# Patient Record
Sex: Male | Born: 1965 | Race: White | Hispanic: No | Smoking: Never smoker
Health system: Southern US, Community
[De-identification: ages and names within clinical notes are randomized; demographics above are authoritative.]

## PROBLEM LIST (undated history)

## (undated) DIAGNOSIS — K5792 Diverticulitis of intestine, part unspecified, without perforation or abscess without bleeding: Secondary | ICD-10-CM

## (undated) DIAGNOSIS — H3321 Serous retinal detachment, right eye: Secondary | ICD-10-CM

## (undated) DIAGNOSIS — F329 Major depressive disorder, single episode, unspecified: Secondary | ICD-10-CM

## (undated) DIAGNOSIS — F32A Depression, unspecified: Secondary | ICD-10-CM

## (undated) DIAGNOSIS — M7070 Other bursitis of hip, unspecified hip: Secondary | ICD-10-CM

## (undated) DIAGNOSIS — F419 Anxiety disorder, unspecified: Secondary | ICD-10-CM

## (undated) HISTORY — DX: Other bursitis of hip, unspecified hip: M70.70

## (undated) HISTORY — DX: Diverticulitis of intestine, part unspecified, without perforation or abscess without bleeding: K57.92

## (undated) HISTORY — DX: Major depressive disorder, single episode, unspecified: F32.9

## (undated) HISTORY — DX: Depression, unspecified: F32.A

## (undated) HISTORY — DX: Anxiety disorder, unspecified: F41.9

## (undated) HISTORY — PX: NO PAST SURGERIES: SHX2092

## (undated) HISTORY — DX: Serous retinal detachment, right eye: H33.21

---

## 2004-10-16 ENCOUNTER — Ambulatory Visit: Payer: Self-pay | Admitting: Internal Medicine

## 2006-11-12 ENCOUNTER — Ambulatory Visit: Payer: Self-pay | Admitting: Internal Medicine

## 2006-11-13 ENCOUNTER — Ambulatory Visit: Payer: Self-pay | Admitting: Internal Medicine

## 2006-11-13 LAB — CONVERTED CEMR LAB
Albumin: 4.2 g/dL (ref 3.5–5.2)
Alkaline Phosphatase: 63 units/L (ref 39–117)
CO2: 30 meq/L (ref 19–32)
Calcium: 9.2 mg/dL (ref 8.4–10.5)
Chol/HDL Ratio, serum: 4.5
Cholesterol: 180 mg/dL (ref 0–200)
Glucose, Bld: 79 mg/dL (ref 70–99)
HDL: 39.6 mg/dL (ref >39.0)
Hemoglobin: 17.2 g/dL — ABNORMAL HIGH (ref 13.0–17.0)
LDL Cholesterol: 121 mg/dL — ABNORMAL HIGH (ref 0–99)
Platelets: 208 10*3/uL (ref 150–400)
RBC: 5.48 M/uL (ref 4.22–5.81)
TSH: 2.3 microintl units/mL (ref 0.35–5.50)
Total Protein: 6.7 g/dL (ref 6.0–8.3)
Triglyceride fasting, serum: 97 mg/dL (ref 0–149)

## 2006-12-24 ENCOUNTER — Ambulatory Visit: Payer: Self-pay | Admitting: Internal Medicine

## 2007-03-11 ENCOUNTER — Encounter: Payer: Self-pay | Admitting: Internal Medicine

## 2007-03-11 ENCOUNTER — Ambulatory Visit: Payer: Self-pay | Admitting: Internal Medicine

## 2007-03-11 DIAGNOSIS — R519 Headache, unspecified: Secondary | ICD-10-CM | POA: Insufficient documentation

## 2007-03-11 DIAGNOSIS — R51 Headache: Secondary | ICD-10-CM | POA: Insufficient documentation

## 2011-01-10 ENCOUNTER — Emergency Department (HOSPITAL_BASED_OUTPATIENT_CLINIC_OR_DEPARTMENT_OTHER)
Admission: EM | Admit: 2011-01-10 | Discharge: 2011-01-10 | Disposition: A | Discharge status: Home / Self Care | Payer: BC Managed Care – PPO | Attending: Emergency Medicine | Admitting: Emergency Medicine

## 2011-01-10 ENCOUNTER — Emergency Department (INDEPENDENT_AMBULATORY_CARE_PROVIDER_SITE_OTHER): Payer: BC Managed Care – PPO

## 2011-01-10 ENCOUNTER — Encounter (INDEPENDENT_AMBULATORY_CARE_PROVIDER_SITE_OTHER): Payer: Self-pay | Admitting: *Deleted

## 2011-01-10 DIAGNOSIS — R109 Unspecified abdominal pain: Secondary | ICD-10-CM | POA: Insufficient documentation

## 2011-01-10 DIAGNOSIS — K5732 Diverticulitis of large intestine without perforation or abscess without bleeding: Secondary | ICD-10-CM | POA: Insufficient documentation

## 2011-01-10 DIAGNOSIS — M2420 Disorder of ligament, unspecified site: Secondary | ICD-10-CM | POA: Insufficient documentation

## 2011-01-10 DIAGNOSIS — M629 Disorder of muscle, unspecified: Secondary | ICD-10-CM | POA: Insufficient documentation

## 2011-01-10 DIAGNOSIS — M7150 Other bursitis, not elsewhere classified, unspecified site: Secondary | ICD-10-CM

## 2011-01-10 LAB — URINALYSIS, ROUTINE W REFLEX MICROSCOPIC
Bilirubin Urine: NEGATIVE
Nitrite: NEGATIVE
Specific Gravity, Urine: 1.023 (ref 1.005–1.030)
pH: 6 (ref 5.0–8.0)

## 2011-01-14 ENCOUNTER — Encounter (INDEPENDENT_AMBULATORY_CARE_PROVIDER_SITE_OTHER): Payer: Self-pay | Admitting: *Deleted

## 2011-01-14 ENCOUNTER — Emergency Department (INDEPENDENT_AMBULATORY_CARE_PROVIDER_SITE_OTHER): Payer: BC Managed Care – PPO

## 2011-01-14 ENCOUNTER — Emergency Department (HOSPITAL_BASED_OUTPATIENT_CLINIC_OR_DEPARTMENT_OTHER)
Admission: EM | Admit: 2011-01-14 | Discharge: 2011-01-15 | Disposition: A | Discharge status: Other Acute Inpatient Hospital | Payer: BC Managed Care – PPO | Source: Home / Self Care | Attending: Emergency Medicine | Admitting: Emergency Medicine

## 2011-01-14 DIAGNOSIS — R109 Unspecified abdominal pain: Secondary | ICD-10-CM | POA: Insufficient documentation

## 2011-01-14 DIAGNOSIS — R197 Diarrhea, unspecified: Secondary | ICD-10-CM

## 2011-01-14 DIAGNOSIS — R110 Nausea: Secondary | ICD-10-CM | POA: Insufficient documentation

## 2011-01-14 DIAGNOSIS — K5732 Diverticulitis of large intestine without perforation or abscess without bleeding: Secondary | ICD-10-CM | POA: Insufficient documentation

## 2011-01-14 LAB — CBC
MCV: 87.2 fL (ref 78.0–100.0)
Platelets: 181 10*3/uL (ref 150–400)
RBC: 5.41 MIL/uL (ref 4.22–5.81)
RDW: 12.8 % (ref 11.5–15.5)
WBC: 8.5 10*3/uL (ref 4.0–10.5)

## 2011-01-14 LAB — BASIC METABOLIC PANEL
BUN: 17 mg/dL (ref 6–23)
Chloride: 104 mEq/L (ref 96–112)
GFR calc Af Amer: >60 mL/min (ref >60)
GFR calc non Af Amer: >60 mL/min (ref >60)
Potassium: 4.4 mEq/L (ref 3.5–5.1)
Sodium: 142 mEq/L (ref 135–145)

## 2011-01-14 LAB — URINALYSIS, ROUTINE W REFLEX MICROSCOPIC
Ketones, ur: NEGATIVE mg/dL
Nitrite: NEGATIVE
Specific Gravity, Urine: 1.011 (ref 1.005–1.030)
Urine Glucose, Fasting: NEGATIVE mg/dL
pH: 7.5 (ref 5.0–8.0)

## 2011-01-14 LAB — DIFFERENTIAL
Basophils Absolute: 0 10*3/uL (ref 0.0–0.1)
Basophils Relative: 0 % (ref 0–1)
Monocytes Relative: 6 % (ref 3–12)
Neutro Abs: 6.7 10*3/uL (ref 1.7–7.7)
Neutrophils Relative %: 79 % — ABNORMAL HIGH (ref 43–77)

## 2011-01-14 MED ORDER — IOHEXOL 300 MG/ML  SOLN
100.0000 mL | Freq: Once | INTRAMUSCULAR | Status: AC | PRN
  Administered 2011-01-14: 100 mL via INTRAVENOUS

## 2011-01-15 ENCOUNTER — Encounter (INDEPENDENT_AMBULATORY_CARE_PROVIDER_SITE_OTHER): Payer: Self-pay | Admitting: *Deleted

## 2011-01-15 ENCOUNTER — Inpatient Hospital Stay (HOSPITAL_COMMUNITY): Payer: BC Managed Care – PPO

## 2011-01-15 ENCOUNTER — Observation Stay (HOSPITAL_COMMUNITY)
Admission: AD | Admit: 2011-01-15 | Discharge: 2011-01-16 | DRG: 183 | Disposition: A | Discharge status: Home / Self Care | Payer: BC Managed Care – PPO | Source: Other Acute Inpatient Hospital | Attending: Family Medicine | Admitting: Family Medicine

## 2011-01-15 DIAGNOSIS — K5732 Diverticulitis of large intestine without perforation or abscess without bleeding: Secondary | ICD-10-CM

## 2011-01-15 DIAGNOSIS — R933 Abnormal findings on diagnostic imaging of other parts of digestive tract: Secondary | ICD-10-CM

## 2011-01-15 DIAGNOSIS — R7309 Other abnormal glucose: Secondary | ICD-10-CM | POA: Diagnosis present

## 2011-01-15 DIAGNOSIS — M6289 Other specified disorders of muscle: Secondary | ICD-10-CM | POA: Diagnosis present

## 2011-01-15 DIAGNOSIS — R1032 Left lower quadrant pain: Secondary | ICD-10-CM

## 2011-01-15 LAB — COMPREHENSIVE METABOLIC PANEL
ALT: 22 U/L (ref 0–53)
AST: 16 U/L (ref 0–37)
Albumin: 3.2 g/dL — ABNORMAL LOW (ref 3.5–5.2)
CO2: 27 mEq/L (ref 19–32)
Calcium: 8.7 mg/dL (ref 8.4–10.5)
Chloride: 109 mEq/L (ref 96–112)
Creatinine, Ser: 1.08 mg/dL (ref 0.4–1.5)
GFR calc Af Amer: >60 mL/min (ref >60)
Sodium: 144 mEq/L (ref 135–145)
Total Bilirubin: 1 mg/dL (ref 0.3–1.2)

## 2011-01-15 LAB — CLOSTRIDIUM DIFFICILE BY PCR: Toxigenic C. Difficile by PCR: NEGATIVE

## 2011-01-15 LAB — GLUCOSE, CAPILLARY
Glucose-Capillary: 96 mg/dL (ref 70–99)
Glucose-Capillary: 129 mg/dL — ABNORMAL HIGH (ref 70–99)

## 2011-01-15 LAB — CBC
Hemoglobin: 14.4 g/dL (ref 13.0–17.0)
MCH: 31.9 pg (ref 26.0–34.0)
Platelets: 148 10*3/uL — ABNORMAL LOW (ref 150–400)
RBC: 4.52 MIL/uL (ref 4.22–5.81)

## 2011-01-16 LAB — BASIC METABOLIC PANEL
CO2: 27 mEq/L (ref 19–32)
Chloride: 109 mEq/L (ref 96–112)
GFR calc Af Amer: >60 mL/min (ref >60)
Potassium: 3.8 mEq/L (ref 3.5–5.1)
Sodium: 139 mEq/L (ref 135–145)

## 2011-01-16 LAB — CBC
Hemoglobin: 13.7 g/dL (ref 13.0–17.0)
Platelets: 151 10*3/uL (ref 150–400)
RBC: 4.45 MIL/uL (ref 4.22–5.81)
WBC: 5.5 10*3/uL (ref 4.0–10.5)

## 2011-01-16 LAB — GLUCOSE, CAPILLARY: Glucose-Capillary: 115 mg/dL — ABNORMAL HIGH (ref 70–99)

## 2011-01-30 NOTE — Discharge Summary (Signed)
NAMEBERTRAM, Ian Vasquez NO.:  [id number]  MEDICAL RECORD NO.:  [id number]           PATIENT TYPE:  I  LOCATION:  5504                         FACILITY:  [facility]  PHYSICIAN:  Brendia Sacks, MD    DATE OF BIRTH:  1965/12/21  DATE OF ADMISSION:  01/15/2011 DATE OF DISCHARGE:  01/16/2011                              DISCHARGE SUMMARY   PRIMARY CARE PHYSICIAN:  Willow Ora, MD  PRIMARY GASTROENTEROLOGIST:  Hedwig Morton. Juanda Chance, MD  CONDITION ON DISCHARGE:  Improved.  DISCHARGE DIAGNOSES: 1. Acute sigmoid diverticulitis and improving distal descending colon     diverticulitis. 2. Mild fasting hyperglycemia.  HISTORY OF PRESENT ILLNESS:  This is a 45 year old male who presented to the emergency room with increasing abdominal pain and diarrhea.  He had recently been diagnosed by CT with diverticulitis and discharged from the emergency room on Cipro and Flagyl.  He was compliant with these medications, however, his condition worsened.  HOSPITAL COURSE: 1. Acute sigmoid diverticulitis.  The patient was admitted to the     Medical Floor, seen in consultation with Gastroenterology.  Given     that he had not responded to Cipro and Flagyl, he was changed to     Zosyn.  He has had marked improvement in his symptoms.  He has had     no pain.  He has not required any pain medications in the last 24     hours and he has tolerated a solid diet.  He has been ambulating in     the hallways without difficulty.  He has been seen by     Gastroenterology today and cleared for discharge.  It has been     recommended he complete a 14-day course of Augmentin.  He has also     been instructed on low-residue diet and diverticular diet.  I have     discussed the case at this point with him and his wife and they are     agreeable with discharge. 2. Mild fasting hyperglycemia of unclear clinical significance given     his acute illness.  Consider testing in the outpatient setting to  exclude diabetes as well as impaired fasting glucose.  CONSULTATIONS:  Gastroenterology and their recommendations as above.  PROCEDURES:  None.  IMAGING: 1. CT of the abdomen and pelvis on January 14, 2011:  Mild acute     sigmoid colon diverticulitis.  Extensive diverticular disease of     the colon.  Persistent but decreasing acute diverticulitis changes     of the distal ascending colon.  Negative for abscess or     perforation.  Stable 9-mm low-density area in the left iliopsoas     muscle which may reflect bursitis or possibly a ganglion. 2. Abdominal film on January 15, 2011:  No acute findings.  MICROBIOLOGY:  None.  PERTINENT LABORATORY STUDIES: 1. Clostridium difficile by PCR negative. 2. CBC unremarkable. 3. Basic metabolic panel unremarkable with exception of mild elevation     of glucose of 102 to 111 in a fasting state. 4. Urinalysis negative.  PHYSICAL EXAMINATION ON DISCHARGE:  GENERAL:  The patient is feeling well.  He is ambulating in the hallways.  He has no abdominal pain.  He tolerated diet. VITAL SIGNS:  Temperature is 97.9, pulse 73, respirations 20, blood pressure 130/81, O2 sat 98% on room air. CARDIOVASCULAR:  Regular rate and rhythm.  No murmur, rub, or gallop. RESPIRATORY:  Clear to auscultation bilaterally.  No wheezes, rales, or rhonchi.  Normal respiratory effort. EXTREMITIES:  No lower extremity edema. ABDOMEN:  Soft, nontender, nondistended.  DISCHARGE INSTRUCTIONS:  The patient will be discharged home today. Diet is a low-residue diet.  If he has abdominal pain, he should switch to liquids.  He may increase activity slowly.  He may return to work on January 20, 2011.  Follow up with Dr. Lina Sar on February 26, 2011, at 9 a.m. and with Dr. Drue Novel in 2-4 weeks.  DISCHARGE MEDICATIONS: 1. Augmentin 875 mg p.o. b.i.d. to complete a 14-day course. 2. Claritin 10 mg p.o. daily as needed for allergies. 3. Multivitamin daily.  Discontinue the  following medications: 1. Ciprofloxacin. 2. Flagyl. 3. Suggest discontinuing his [employer] supplement and protein supplement for     this point in time.  THINGS TO FOLLOW UP IN THE OUTPATIENT SETTING: 1. Resolution of diverticulitis.  Consider colonoscopy when resolved. 2. Fasting hyperglycemia.  See discussion above.  TIME COORDINATING DISCHARGE:  35 minutes.     Brendia Sacks, MD     DG/MEDQ  D:  01/16/2011  T:  01/17/2011  Job:  [id number]  cc:   Willow Ora, MD Hedwig Morton. Juanda Chance, MD  Electronically Signed by Brendia Sacks  on 01/30/2011 08:06:33 AM

## 2011-02-03 NOTE — H&P (Signed)
Ian Vasquez, FELDNER               ACCOUNT NO.:  [id number]  MEDICAL RECORD NO.:  [id number]           PATIENT TYPE:  I  LOCATION:  5504                         FACILITY:  [facility]  PHYSICIAN:  Eduard Clos, MDDATE OF BIRTH:  22-Mar-1966  DATE OF ADMISSION:  01/15/2011 DATE OF DISCHARGE:                             HISTORY & PHYSICAL   PRIMARY CARE PHYSICIAN:  At [facility], the patient does not recall the name at this time.  CHIEF COMPLAINT:  Abdominal pain and diarrhea.  HISTORY OF PRESENT ILLNESS:  A 45 year old male with no significant past medical history who has been experiencing abdominal pain over the last 1 week.  He had gone initially to the ER where he had a CT abdomen and pelvis on January 10, 2011, which showed acute diverticulitis of the distal descending colon.  The patient was given Cipro and Flagyl and sent home after which the patient felt better, but again the pain started coming last 24 hours with worsening diarrhea, at least 10 episodes.  Denies any blood in the stools.  The patient comes back and in the ER, the patient had repeat CT abdomen and pelvis the last evening, which showed mild acute sigmoid colon diverticulitis and also persistent but decreasing acute diverticulitis of the distal descending colon.  The ER physician had called on-call gastroenterologist of [place] who advised admission.  At this time, the patient is admitted for further observation for his diverticulitis.  The patient denies any nausea or vomiting.  Denies any chest pain. Denies shortness of breath, fever, chills, palpitations, dizziness, loss of consciousness, or any focal deficit.  PAST MEDICAL HISTORY:  Nothing significant.  PAST SURGICAL HISTORY:  None.  MEDICATIONS ON ADMISSION:  Just recently on Cipro and Flagyl, otherwise does not take a regular medication.  ALLERGIES:  No known drug allergies.  FAMILY HISTORY:  Significant for coronary artery disease  in grandparents.  SOCIAL HISTORY:  The patient denies smoking cigarettes, drinking alcohol, or using illegal drugs.  Married.  REVIEW OF SYSTEMS:  As per the history of present illness, nothing else significant.  PHYSICAL EXAMINATION:  GENERAL:  The patient was examined at bedside, not in acute distress. VITAL SIGNS:  Blood pressure 130/76, pulse 70 per minute, temperature 97.8, respirations 18, O2 sat 96% on room air. HEENT:  Anicteric.  No pallor.  No discharge from ears, eyes, nose, or mouth. NECK:  No neck rigidity. CHEST:  Bilateral air entry present.  No rhonchi.  No crepitation. HEART:  S1 and S2 heard. ABDOMEN:  Soft.  There is tenderness in the suprapubic area.  The patient stated that earlier on the pain used to be on the left upper quadrant.  No guarding or rigidity.  Bowel sounds are present. CNS:  Alert, awake, and oriented to time, place, and person.  Moves upper and lower extremities 5/5. EXTREMITIES:  Peripheral pulses felt.  No edema.  LABORATORY DATA:  CT of abdomen and pelvis done today shows mild acute sigmoid colon diverticulitis, which is new compared to the CT of January 10, 2011.  The patient has extensive diverticular disease of the sigmoid colon, although the  patient's acute illness resolved.  Followup colonoscopy should be considered given the associated colonic wall thickening, persistent but decreasing acute diverticulitis changes of the distal descending colon compared to January 12, 2011.  Negative for abscess or perforation.  Stable 9-mm low density in the left iliopsoas muscle.  This could reflect changes of bursitis or possibly a ganglion. CBC:  WBC is 8.5, hemoglobin is 17.3, hematocrit 47.2, platelets 181. Basic metabolic panel of sodium 142, potassium 4.4, chloride 104, carbon dioxide 28, glucose 101, BUN 17, creatinine 1, calcium 9.1.  UA is negative.  Fecal occult blood is negative.  ASSESSMENT: 1. Acute sigmoid diverticulitis with recent  descending colon     diverticulitis. 2. Stable 9-mm low density area in the left iliopsoas muscle, which     could be bursitis or ganglion.  PLAN: 1. Admit the patient to medical floor. 2. For his diverticulitis at this time, the patient will be kept     n.p.o., pain relief medication, IV hydration.  Repeat labs in a.m.     and if the patient's pain is improving, start clear liquid diet and     also get a KUB. 3. We will get stool for C. diff, PCR. 4. Once the patient's acute episode is over, the patient will need     colonoscopy as outpatient.     Eduard Clos, MD     ANK/MEDQ  D:  01/15/2011  T:  01/15/2011  Job:  [id number]  Electronically Signed by Midge Minium MD on 02/03/2011 04:45:29 PM

## 2011-02-26 ENCOUNTER — Encounter: Payer: Self-pay | Admitting: Internal Medicine

## 2011-02-26 ENCOUNTER — Ambulatory Visit (INDEPENDENT_AMBULATORY_CARE_PROVIDER_SITE_OTHER): Payer: BC Managed Care – PPO | Admitting: Internal Medicine

## 2011-02-26 VITALS — Ht 73.0 in | Wt 209.0 lb

## 2011-02-26 DIAGNOSIS — K5730 Diverticulosis of large intestine without perforation or abscess without bleeding: Secondary | ICD-10-CM

## 2011-02-26 MED ORDER — PEG-KCL-NACL-NASULF-NA ASC-C 100 G PO SOLR: 1.0000 | Freq: Once | ORAL | Status: AC

## 2011-02-26 NOTE — Progress Notes (Signed)
History of Present Illness:  This is a  45 year old white male status post hospitalization for diverticulitis from 2/8 to 01/16/2011 . A CT scan showed inflammatory changes in the sigmoid colon.  He responded to intravenous Zosyn and was discharged on Augmentin which he completed several weeks ago. He has normal bowel habits, no rectal bleeding and no abdominal pain. He is here to discuss having a screening colonoscopy.        Review of Systems:    t ROS is negative.   Physical Exam: General appearance alert oriented x 3. In no distress. Eyes- non icteric Mouth no lesions Neck supple without adenopathy, thyroid not enlarged, no carotid bruits Lungs Clear to auscultation Cor normal S1 normal S2 , no murmur,  quiet precordium Abdomen, soft scaphoid abdomen with normal active bowel sounds. No point tenderness. Liver edge at costal margin. No CVA tenderness Rectal no perianal disease, normal rectal sphincter tone, hemoccult negative stool Extremities no pedal edema Skin no lesions Neurological no asterixis Psychological normal affect  Assessment and Plan:   45 year old white male status post a first episode of diverticulitis documented on CT scan. He is currently asymptomatic. We will schedule a screening colonoscopy to assess the severity of his diverticulosis and to rule out other causes for his  abdominal pain. He is to continue on his fiber supplements and high fiber diet.

## 2011-02-26 NOTE — Patient Instructions (Signed)
You have been scheduled for a colonoscopy on 03/13/11. Please follow separate instruction sheet.

## 2011-02-27 ENCOUNTER — Encounter: Payer: Self-pay | Admitting: Internal Medicine

## 2011-03-12 ENCOUNTER — Encounter: Payer: Self-pay | Admitting: Internal Medicine

## 2011-03-13 ENCOUNTER — Encounter: Payer: Self-pay | Admitting: Internal Medicine

## 2011-03-13 ENCOUNTER — Ambulatory Visit (AMBULATORY_SURGERY_CENTER): Payer: BC Managed Care – PPO | Admitting: Internal Medicine

## 2011-03-13 VITALS — BP 143/71 | HR 71 | Temp 97.8°F | Resp 16

## 2011-03-13 DIAGNOSIS — K5732 Diverticulitis of large intestine without perforation or abscess without bleeding: Secondary | ICD-10-CM

## 2011-03-13 DIAGNOSIS — R933 Abnormal findings on diagnostic imaging of other parts of digestive tract: Secondary | ICD-10-CM

## 2011-03-13 DIAGNOSIS — R109 Unspecified abdominal pain: Secondary | ICD-10-CM

## 2011-03-13 DIAGNOSIS — K5730 Diverticulosis of large intestine without perforation or abscess without bleeding: Secondary | ICD-10-CM

## 2011-03-13 DIAGNOSIS — Z1211 Encounter for screening for malignant neoplasm of colon: Secondary | ICD-10-CM

## 2011-03-13 MED ORDER — SODIUM CHLORIDE 0.9 % IV SOLN: 500.0000 mL | INTRAVENOUS | Status: DC

## 2011-03-13 NOTE — Patient Instructions (Signed)
Severe diverticulosis otherwise normal exam High Fiber diet. Recall colonoscopy in 10 yrs. See blue and green sheets for additional d/c instructions

## 2011-03-17 ENCOUNTER — Telehealth: Payer: Self-pay

## 2011-03-17 NOTE — Telephone Encounter (Signed)
No answer. Unable to leave message.

## 2012-04-05 IMAGING — CT CT ABD-PELV W/ CM
2 of 5 series · 15 of 46 positions shown, 17 images · IV contrast (agent unspecified)
Comparison: CT abdomen pelvis 01/10/2011, performed without
contrast.

CLINICAL DATA: Abdominal pain and diarrhea.

CT ABDOMEN AND PELVIS WITH CONTRAST
TECHNIQUE: Multidetector CT imaging of the abdomen and pelvis was
performed following the standard protocol during bolus
administration of intravenous contrast.
Contrast: 100 ml

[Series 2: abd/pelvis 5.0 b31f · axial · 0.73mm/px · z∈[-491,-51]mm · 12 of 100 slices shown, 14 images]
[im 6/100  soft-tissue]
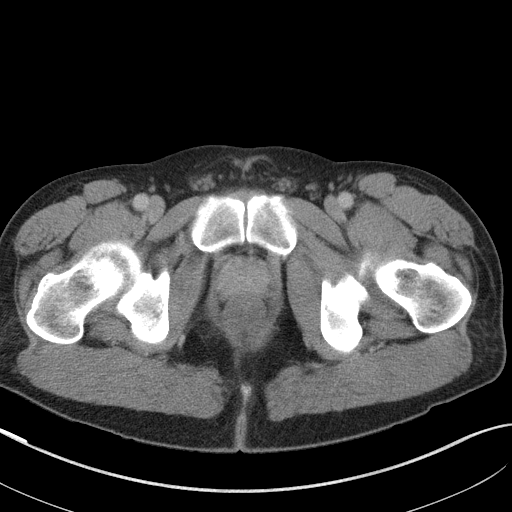
[im 6/100  bone]
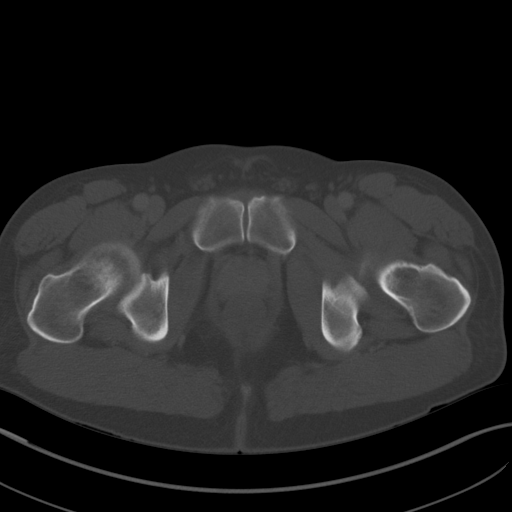
[im 16/100  soft-tissue]
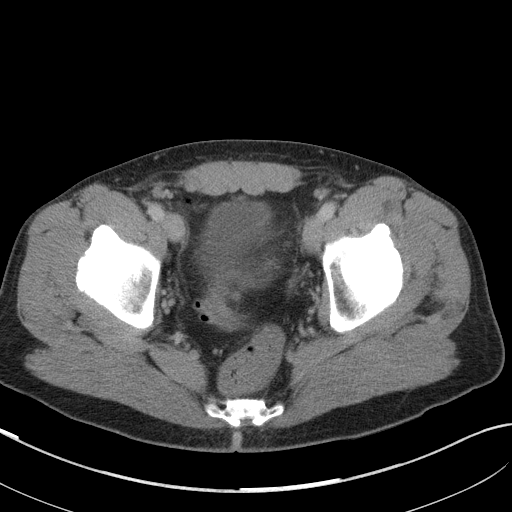
[im 21/100  soft-tissue]
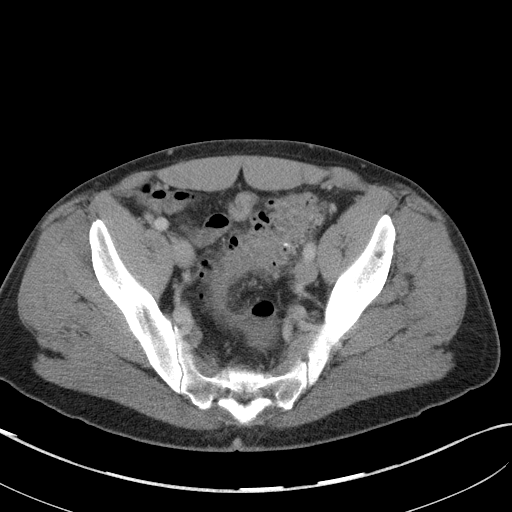
[im 32/100  soft-tissue]
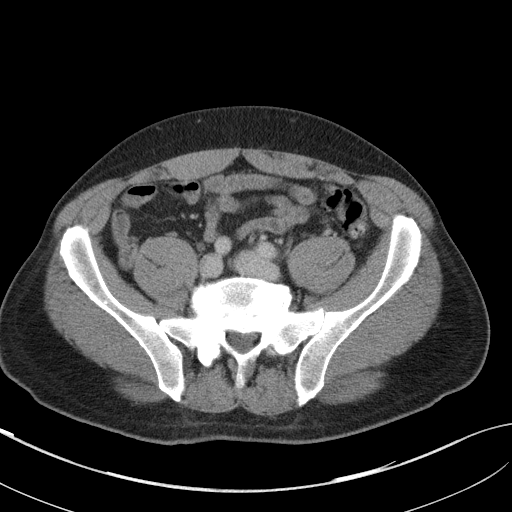
[im 37/100  soft-tissue]
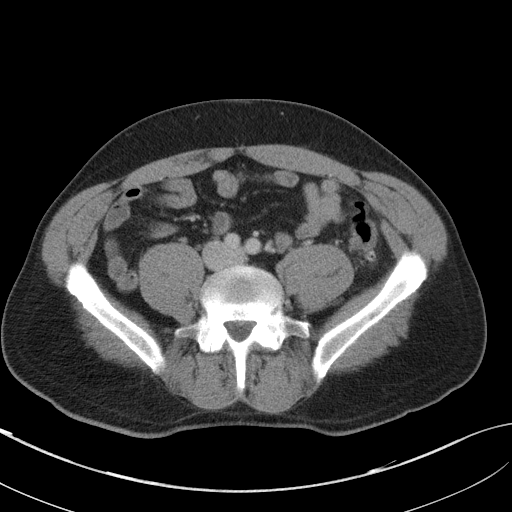
[im 47/100  soft-tissue]
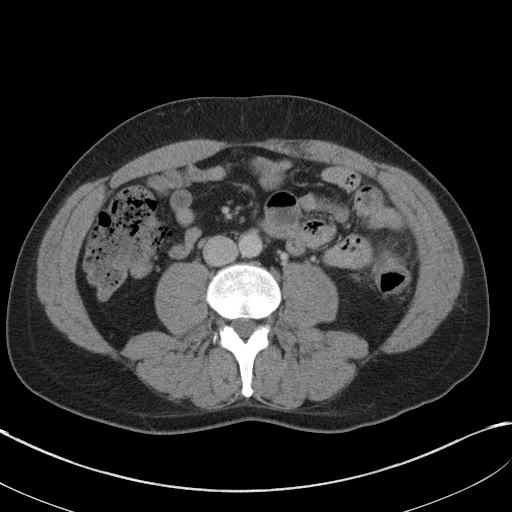
[im 53/100  soft-tissue]
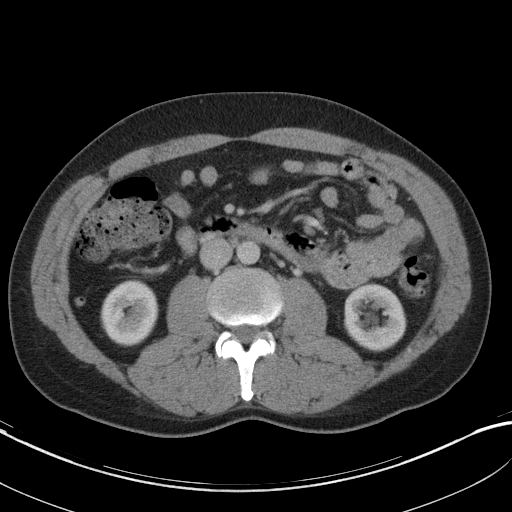
[im 63/100  soft-tissue]
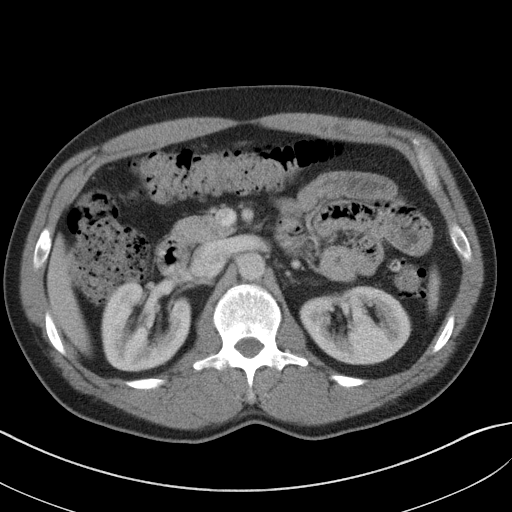
[im 68/100  soft-tissue]
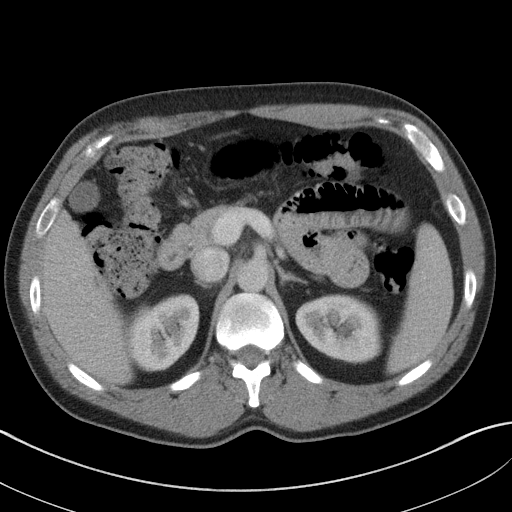
[im 68/100  bone]
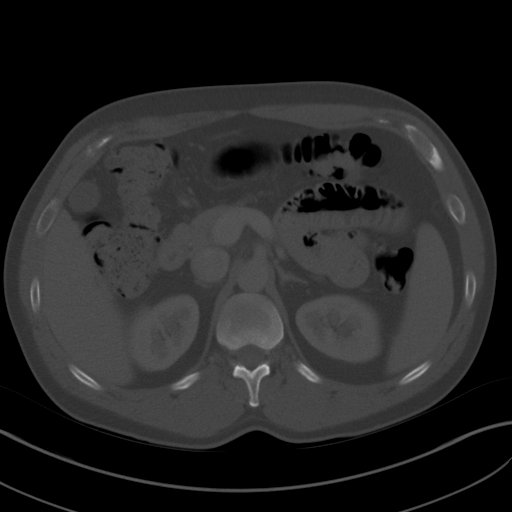
[im 79/100  soft-tissue]
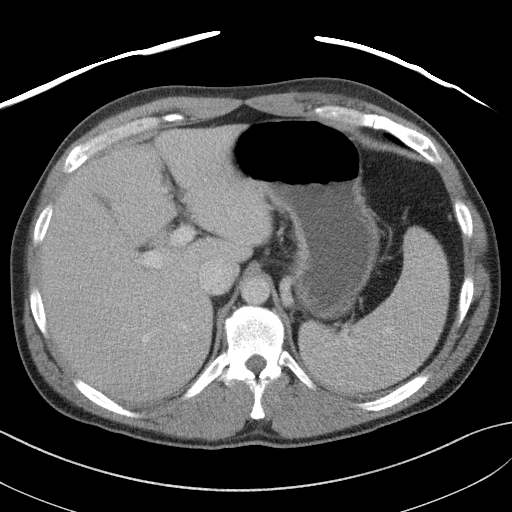
[im 84/100  soft-tissue]
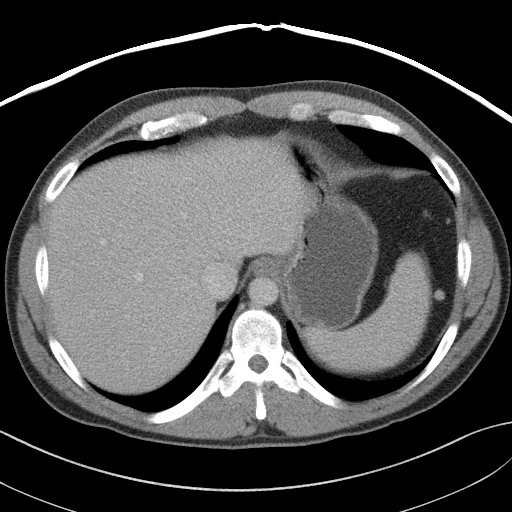
[im 94/100  soft-tissue]
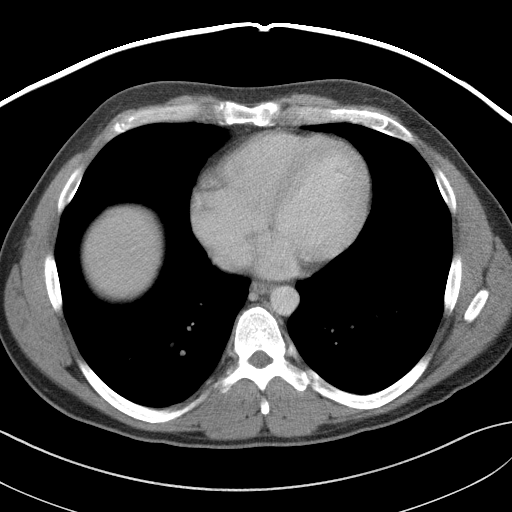

[Series 5: abd/pelvis 3.0 coronal · coronal · 0.72mm/px · 3 of 87 slices shown]
[im 29/87  soft-tissue]
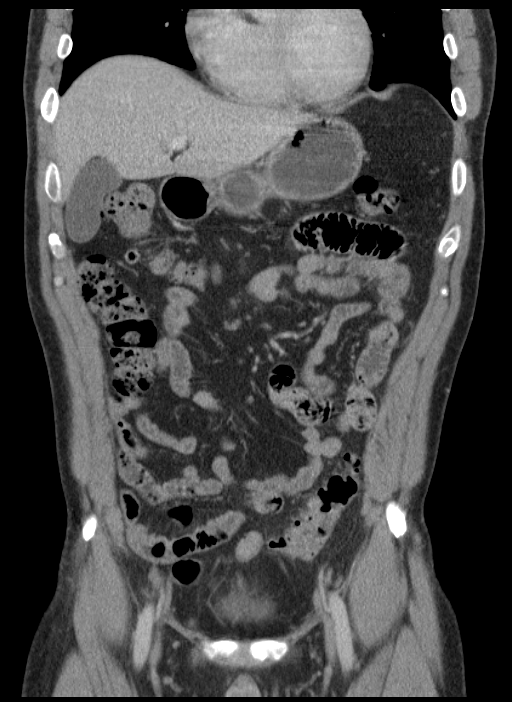
[im 39/87  soft-tissue]
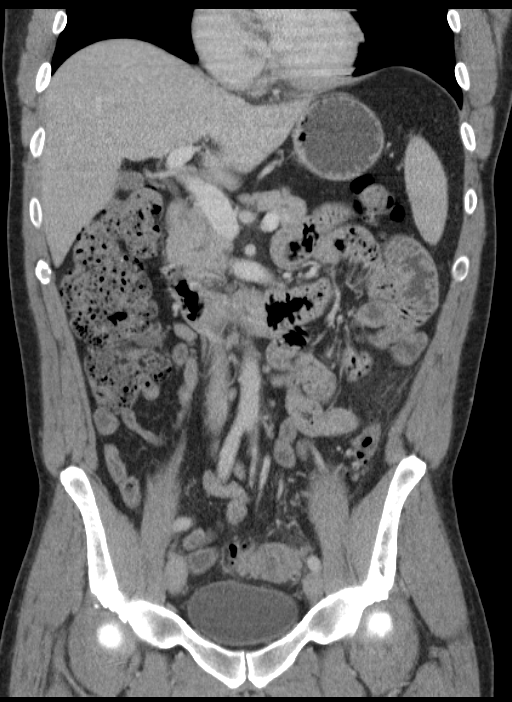
[im 48/87  soft-tissue]
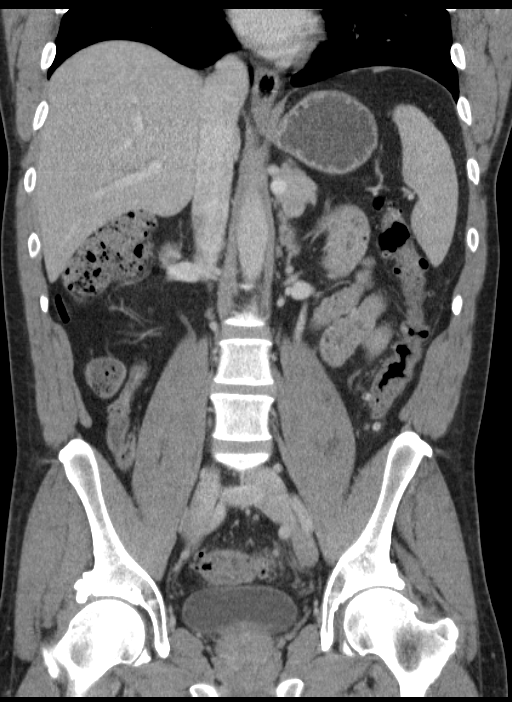

[15 of 46 positions shown; findings below may reference images not displayed]

FINDINGS: The lung bases are clear and heart size is normal.

Previously identified acute diverticulitis changes associated with
the distal descending colon persist but are less prominent compared
to the study of 01/10/2011.  Specifically, the mesenteric stranding
has decreased, but has not resolved.  There is no extraluminal gas
or evidence of abscess.

There is extensive diverticular disease involving the sigmoid
colon, with innumerable diverticula and evidence of colonic wall
thickening.  There is haziness in the mesenteries surrounding the
proximal sigmoid colon in the midline pelvis.  This haziness is new
compared to prior examination and suggests mild acute
diverticulitis involving the sigmoid colon.  No extraluminal gas,
abscess, or ascites.

There is no lymphadenopathy.

The liver, gallbladder, spleen, pancreas, adrenal glands, and
kidneys show no acute or suspicious findings.  Small bilateral
parapelvic renal cysts are noted.  On delayed images there is
symmetric excretion of contrast in both kidneys.  The ureters are
normal in caliber.  The abdominal aorta is normal in caliber.
There is no lymphadenopathy or free intraperitoneal air.  The
appendix is normal.  The urinary bladder, prostate gland, seminal
vesicles are unremarkable.  Inguinal regions appear normal.

9 mm low density area in the left iliopsoas muscle is unchanged
compared to prior study. Degenerative disc disease noted at L5-S1.
There are degenerative changes of the hips bilaterally, right
greater than left.
IMPRESSION: 1.  Mild acute sigmoid colon diverticulitis.  This is new compared
to the CT of 01/10/2011.  The patient has extensive diverticular
disease of the sigmoid colon.  After the patient's acute illness
resolves, follow-up colonoscopy should be considered, given the
associated colonic wall thickening.

2.  Persistent but decreasing acute diverticulitis changes of the
distal descending colon compared to 01/10/2011.
3.  Negative for abscess or perforation.
4.  Stable 9 mm low density area in the left iliopsoas muscle.
This could reflect changes of bursitis, or possibly a ganglion.

## 2013-08-16 ENCOUNTER — Ambulatory Visit (INDEPENDENT_AMBULATORY_CARE_PROVIDER_SITE_OTHER): Payer: BC Managed Care – PPO | Admitting: Internal Medicine

## 2013-08-16 ENCOUNTER — Encounter: Payer: Self-pay | Admitting: Internal Medicine

## 2013-08-16 VITALS — BP 147/84 | HR 73 | Temp 97.9°F | Ht 73.0 in | Wt 213.8 lb

## 2013-08-16 DIAGNOSIS — Z0000 Encounter for general adult medical examination without abnormal findings: Secondary | ICD-10-CM

## 2013-08-16 DIAGNOSIS — Z23 Encounter for immunization: Secondary | ICD-10-CM

## 2013-08-16 LAB — CBC WITH DIFFERENTIAL/PLATELET
Eosinophils Relative: 3.1 % (ref 0.0–5.0)
HCT: 46.7 % (ref 39.0–52.0)
Hemoglobin: 16.1 g/dL (ref 13.0–17.0)
Lymphs Abs: 2.3 10*3/uL (ref 0.7–4.0)
Monocytes Relative: 6.8 % (ref 3.0–12.0)
Neutro Abs: 2.6 10*3/uL (ref 1.4–7.7)
RBC: 5.07 Mil/uL (ref 4.22–5.81)
WBC: 5.4 10*3/uL (ref 4.5–10.5)

## 2013-08-16 LAB — COMPREHENSIVE METABOLIC PANEL
ALT: 19 U/L (ref 0–53)
CO2: 29 mEq/L (ref 19–32)
Calcium: 8.8 mg/dL (ref 8.4–10.5)
Chloride: 108 mEq/L (ref 96–112)
Creatinine, Ser: 1 mg/dL (ref 0.4–1.5)
GFR: 83.84 mL/min (ref >60.00)
Sodium: 141 mEq/L (ref 135–145)
Total Protein: 6.4 g/dL (ref 6.0–8.3)

## 2013-08-16 LAB — TSH: TSH: 2.34 u[IU]/mL (ref 0.35–5.50)

## 2013-08-16 NOTE — Progress Notes (Signed)
  Subjective:    Patient ID: Ian Vasquez, male    DOB: 1966-01-29, 47 y.o.   MRN: [id number]  HPI New pt, CPX  Past Medical History  Diagnosis Date  . Diverticulitis   . Diverticulosis   . Iliopsoas bursitis    Past Surgical History  Procedure Laterality Date  . Neck dissection     History   Social History  . Marital Status: Married    Spouse Name: N/A    Number of Children: 2  . Years of Education: N/A   Occupational History  . Employed Engineer, production   Social History Main Topics  . Smoking status: Never Smoker   . Smokeless tobacco: Never Used  . Alcohol Use: Yes     Comment: one a week   . Drug Use: No  . Sexual Activity: Not on file   Other Topics Concern  . Not on file   Social History Narrative  . No narrative on file   Family History  Problem Relation Age of Onset  . Colon cancer Neg Hx   . Prostate cancer Neg Hx   . CAD Other     GM dx in her 55s  . Diabetes Neg Hx   . Hypertension Father      Review of Systems Diet--regular healthy Exercise--very active, does the elliptical frequently. Occasional pain in the thoracic paraspinal muscles when he does the elliptical, no radiation, symptoms are not consistent. Denies chest pain, shortness of breath. No nausea, vomiting, diarrhea or blood in the stools. No dysuria, gross hematuria, difficulty urinating. No anxiety depression.     Objective:   Physical Exam General -- alert, well-developed, NAD.  Neck --no thyromegaly , normal carotid pulse  Lungs -- normal respiratory effort, no intercostal retractions, no accessory muscle use, and normal breath sounds.  Heart-- normal rate, regular rhythm, no murmur.  Abdomen-- Not distended, good bowel sounds,soft, non-tender. No rebound or rigidity. Tspine: no TTP Extremities-- no pretibial edema bilaterally  Neurologic-- alert & oriented X3. Speech, gait normal. Strength normal in all extremities.  Psych-- Cognition and judgment appear  intact. Alert and cooperative with normal attention span and concentration. not anxious appearing and not depressed appearing.     Assessment & Plan:

## 2013-08-16 NOTE — Patient Instructions (Signed)
Get your blood work before you leave  Next visit in  1 year for a physical exam Please make an appointment before you leave the office today (or call few weeks in advance)

## 2013-08-16 NOTE — Assessment & Plan Note (Addendum)
Last Tdap~ 10 years, one provided today cscope 2012-- tics, diverticulitis Labs EKG-- iRBBB, no old EKGs, no sx  BP 147/84 Diet-exercise : doing well RTC 1 year Tspine pain--stretching, motrin, call if sx increase

## 2013-08-18 ENCOUNTER — Telehealth: Payer: Self-pay | Admitting: *Deleted

## 2013-08-18 ENCOUNTER — Encounter: Payer: Self-pay | Admitting: *Deleted

## 2013-08-18 NOTE — Telephone Encounter (Signed)
Message copied by Eustace Quail on Thu Aug 18, 2013  4:26 PM ------      Message from: Ian Vasquez      Created: Thu Aug 18, 2013  4:18 PM       Send a letter      Marcial Pacas,      Your liver, kidney, potassium, blood count and thyroid tests are normal.      Your cholesterol is very good at 146.      These are good results! ------

## 2013-08-18 NOTE — Telephone Encounter (Signed)
Letter sent. DJR  

## 2014-08-23 ENCOUNTER — Encounter: Payer: Self-pay | Admitting: Internal Medicine

## 2014-08-23 ENCOUNTER — Ambulatory Visit (INDEPENDENT_AMBULATORY_CARE_PROVIDER_SITE_OTHER): Payer: BC Managed Care – PPO | Admitting: Internal Medicine

## 2014-08-23 VITALS — BP 130/68 | HR 63 | Temp 98.7°F | Ht 73.0 in | Wt 220.0 lb

## 2014-08-23 DIAGNOSIS — Z0000 Encounter for general adult medical examination without abnormal findings: Secondary | ICD-10-CM

## 2014-08-23 DIAGNOSIS — M25511 Pain in right shoulder: Secondary | ICD-10-CM

## 2014-08-23 DIAGNOSIS — M25519 Pain in unspecified shoulder: Secondary | ICD-10-CM | POA: Insufficient documentation

## 2014-08-23 NOTE — Progress Notes (Signed)
Pre visit review using our clinic review tool, if applicable. No additional management support is needed unless otherwise documented below in the visit note. 

## 2014-08-23 NOTE — Patient Instructions (Signed)
Get your blood work before you leave   Motrin 200 mg 2 tablets every 6 hours as needed for pain. Always take it with food because may cause gastritis and ulcers. If you notice nausea, stomach pain, change in the color of stools --->  Stop the medicine and let us know   Please come back to the office in 1 year for a physical exam. Come back fasting

## 2014-08-23 NOTE — Assessment & Plan Note (Signed)
2 months history of pain in the right trapezoid area,  Trapezoid a sprain? Other considerations are a radiculopathy or intrinsic shoulder problem. Plan: Motrin Ortho referral

## 2014-08-23 NOTE — Assessment & Plan Note (Signed)
Tdap 2014 Flu shot declined cscope 2012-- tics, diverticulitis Labs  BP today ok, check at the dentist x2/ year and is normal Diet-exercise : counseled  RTC 1 year

## 2014-08-23 NOTE — Progress Notes (Signed)
   Subjective:    Patient ID: PAGE LANCON, male    DOB: 08/08/1966, 48 y.o.   MRN: [id number]  DOS:  08/23/2014 Type of visit - description : CPX Interval history: In general doing well except for the last 2 months when gradually developed pain at the right trapezoid area. Has not taken any medication for the pain, denies neck pain per se, no upper extremity numbness, no actual injury.    ROS No  CP, SOB. No palpitations Denies  nausea, vomiting diarrhea, blood in the stools (-) cough, sputum production (-) wheezing, chest congestion No dysuria, gross hematuria, difficulty urinating  No anxiety, depression  Past Medical History  Diagnosis Date  . Diverticulitis   . Diverticulosis   . Iliopsoas bursitis     Past Surgical History  Procedure Laterality Date  . Neck dissection      History   Social History  . Marital Status: Married    Spouse Name: N/A    Number of Children: 2  . Years of Education: N/A   Occupational History  . Employed Sports coach   Social History Main Topics  . Smoking status: Never Smoker   . Smokeless tobacco: Never Used  . Alcohol Use: Yes     Comment: one a week   . Drug Use: No  . Sexual Activity: Not on file   Other Topics Concern  . Not on file   Social History Narrative   Lives w/ wife        Medication List       This list is accurate as of: 08/23/14  6:14 PM.  Always use your most recent med list.               BIOTIN PO  Take 2 tablets by mouth daily.     FIBER FORMULA Caps  Two by mouth once daily     MULTIVITAMIN PO  One tablet by mouth once daily           Objective:   Physical Exam  Musculoskeletal:       Arms:  BP 130/68  Pulse 63  Temp(Src) 98.7 F (37.1 C) (Oral)  Ht 6\' 1"  (1.854 m)  Wt 220 lb (99.791 kg)  BMI 29.03 kg/m2  SpO2 99% General -- alert, well-developed, NAD.  Neck --no thyromegaly , normal carotid pulse . No TTP, Range of motion is normal although it triggers mild  pain on the right trapezoid area. HEENT-- Not pale.  Lungs -- normal respiratory effort, no intercostal retractions, no accessory muscle use, and normal breath sounds.  Heart-- normal rate, regular rhythm, no murmur.  Abdomen-- Not distended, good bowel sounds,soft, non-tender.  Extremities-- no pretibial edema bilaterally  Left shoulder normal Right shoulder with normal range of motion and minimal pain. No deformities. Not tender at the a.c. joint   Neurologic--  alert & oriented X3. Speech normal, gait appropriate for age, strength symmetric and appropriate for age.  DTRs symmetric.  Psych-- Cognition and judgment appear intact. Cooperative with normal attention span and concentration. No anxious or depressed appearing.        Assessment & Plan:

## 2014-08-24 LAB — BASIC METABOLIC PANEL
BUN: 18 mg/dL (ref 6–23)
CHLORIDE: 104 meq/L (ref 96–112)
CO2: 26 meq/L (ref 19–32)
Calcium: 8.8 mg/dL (ref 8.4–10.5)
Creatinine, Ser: 0.9 mg/dL (ref 0.4–1.5)
GFR: 91.83 mL/min (ref >60.00)
Glucose, Bld: 75 mg/dL (ref 70–99)
POTASSIUM: 3.5 meq/L (ref 3.5–5.1)
Sodium: 137 mEq/L (ref 135–145)

## 2014-08-24 LAB — LIPID PANEL
CHOL/HDL RATIO: 4
CHOLESTEROL: 144 mg/dL (ref 0–200)
HDL: 37.9 mg/dL — AB (ref >39.00)
LDL Cholesterol: 87 mg/dL (ref 0–99)
NonHDL: 106.1
TRIGLYCERIDES: 97 mg/dL (ref 0.0–149.0)
VLDL: 19.4 mg/dL (ref 0.0–40.0)

## 2014-11-22 ENCOUNTER — Encounter (HOSPITAL_BASED_OUTPATIENT_CLINIC_OR_DEPARTMENT_OTHER): Payer: Self-pay

## 2014-11-22 ENCOUNTER — Emergency Department (HOSPITAL_BASED_OUTPATIENT_CLINIC_OR_DEPARTMENT_OTHER): Payer: BC Managed Care – PPO

## 2014-11-22 ENCOUNTER — Emergency Department (HOSPITAL_BASED_OUTPATIENT_CLINIC_OR_DEPARTMENT_OTHER)
Admission: EM | Admit: 2014-11-22 | Discharge: 2014-11-22 | Disposition: A | Discharge status: Home / Self Care | Payer: BC Managed Care – PPO | Attending: Emergency Medicine | Admitting: Emergency Medicine

## 2014-11-22 DIAGNOSIS — R070 Pain in throat: Secondary | ICD-10-CM | POA: Diagnosis present

## 2014-11-22 DIAGNOSIS — R0789 Other chest pain: Secondary | ICD-10-CM | POA: Diagnosis not present

## 2014-11-22 DIAGNOSIS — R0602 Shortness of breath: Secondary | ICD-10-CM | POA: Insufficient documentation

## 2014-11-22 DIAGNOSIS — Z8739 Personal history of other diseases of the musculoskeletal system and connective tissue: Secondary | ICD-10-CM | POA: Diagnosis not present

## 2014-11-22 DIAGNOSIS — J385 Laryngeal spasm: Secondary | ICD-10-CM | POA: Diagnosis not present

## 2014-11-22 DIAGNOSIS — Z79899 Other long term (current) drug therapy: Secondary | ICD-10-CM | POA: Insufficient documentation

## 2014-11-22 DIAGNOSIS — R0989 Other specified symptoms and signs involving the circulatory and respiratory systems: Secondary | ICD-10-CM | POA: Diagnosis not present

## 2014-11-22 DIAGNOSIS — Z8719 Personal history of other diseases of the digestive system: Secondary | ICD-10-CM | POA: Insufficient documentation

## 2014-11-22 LAB — BASIC METABOLIC PANEL
Anion gap: 10 (ref 5–15)
BUN: 14 mg/dL (ref 6–23)
CALCIUM: 9 mg/dL (ref 8.4–10.5)
CO2: 27 mEq/L (ref 19–32)
CREATININE: 0.9 mg/dL (ref 0.50–1.35)
Chloride: 106 mEq/L (ref 96–112)
GFR calc Af Amer: >90 mL/min (ref >90)
GLUCOSE: 89 mg/dL (ref 70–99)
Potassium: 3.9 mEq/L (ref 3.7–5.3)
Sodium: 143 mEq/L (ref 137–147)

## 2014-11-22 LAB — CBC WITH DIFFERENTIAL/PLATELET
BASOS ABS: 0 10*3/uL (ref 0.0–0.1)
BASOS PCT: 0 % (ref 0–1)
Band Neutrophils: 0 % (ref 0–10)
Blasts: 0 %
EOS ABS: 0.2 10*3/uL (ref 0.0–0.7)
Eosinophils Relative: 3 % (ref 0–5)
HCT: 47.6 % (ref 39.0–52.0)
Hemoglobin: 17.1 g/dL — ABNORMAL HIGH (ref 13.0–17.0)
Lymphocytes Relative: 43 % (ref 12–46)
Lymphs Abs: 2.2 10*3/uL (ref 0.7–4.0)
MCH: 32.1 pg (ref 26.0–34.0)
MCHC: 35.9 g/dL (ref 30.0–36.0)
MCV: 89.5 fL (ref 78.0–100.0)
MONO ABS: 0.4 10*3/uL (ref 0.1–1.0)
MONOS PCT: 8 % (ref 3–12)
MYELOCYTES: 0 %
Metamyelocytes Relative: 0 %
NEUTROS ABS: 2.2 10*3/uL (ref 1.7–7.7)
NRBC: 0 /100{WBCs}
Neutrophils Relative %: 46 % (ref 43–77)
PLATELETS: 140 10*3/uL — AB (ref 150–400)
Promyelocytes Absolute: 0 %
RBC: 5.32 MIL/uL (ref 4.22–5.81)
RDW: 12.9 % (ref 11.5–15.5)
WBC: 5 10*3/uL (ref 4.0–10.5)

## 2014-11-22 LAB — TROPONIN I
Troponin I: <0.3 ng/mL (ref <0.30)
Troponin I: <0.3 ng/mL (ref <0.30)

## 2014-11-22 MED ORDER — OXYCODONE-ACETAMINOPHEN 5-325 MG PO TABS
1.0000 | ORAL_TABLET | Freq: Once | ORAL | Status: DC
  Filled 2014-11-22: qty 1

## 2014-11-22 MED ORDER — FAMOTIDINE 20 MG PO TABS: 20.0000 mg | ORAL_TABLET | Freq: Two times a day (BID) | ORAL | Status: DC

## 2014-11-22 MED ORDER — ASPIRIN 81 MG PO CHEW
324.0000 mg | CHEWABLE_TABLET | Freq: Once | ORAL | Status: AC
  Administered 2014-11-22: 324 mg via ORAL
  Filled 2014-11-22: qty 4

## 2014-11-22 MED ORDER — PANTOPRAZOLE SODIUM 20 MG PO TBEC: 20.0000 mg | DELAYED_RELEASE_TABLET | Freq: Every day | ORAL | Status: DC

## 2014-11-22 MED ORDER — GI COCKTAIL ~~LOC~~
30.0000 mL | Freq: Once | ORAL | Status: AC
  Administered 2014-11-22: 30 mL via ORAL
  Filled 2014-11-22: qty 30

## 2014-11-22 MED ORDER — PREDNISONE 50 MG PO TABS: 50.0000 mg | ORAL_TABLET | Freq: Every day | ORAL | Status: DC

## 2014-11-22 MED ORDER — PREDNISONE 50 MG PO TABS: 60.0000 mg | ORAL_TABLET | Freq: Once | ORAL | Status: DC

## 2014-11-22 MED ORDER — PREDNISONE 50 MG PO TABS
60.0000 mg | ORAL_TABLET | Freq: Once | ORAL | Status: AC
  Administered 2014-11-22: 60 mg via ORAL
  Filled 2014-11-22 (×2): qty 1

## 2014-11-22 NOTE — Discharge Instructions (Signed)
Your chest xray and lab work did not show any major findings to explain your symptoms. It is possible that your symptoms are caused by inflammation, infection, or spasms in your larynx. Take protonix and pepcid as prescribed for possible reflux. Prednisone for inflammation. If symptoms continue, follow up with ENT as referred. Return if worsening symptoms, unable to breathe, any new concerning symptom.   Laryngitis At the top of your windpipe is your voice box. It is the source of your voice. Inside your voice box are 2 bands of muscles called vocal cords. When you breathe, your vocal cords are relaxed and open so that air can get into the lungs. When you decide to say something, these cords come together and vibrate. The sound from these vibrations goes into your throat and comes out through your mouth as sound. Laryngitis is an inflammation of the vocal cords that causes hoarseness, cough, loss of voice, sore throat, and dry throat. Laryngitis can be temporary (acute) or long-term (chronic). Most cases of acute laryngitis improve with time.Chronic laryngitis lasts for more than 3 weeks. CAUSES Laryngitis can often be related to excessive smoking, talking, or yelling, as well as inhalation of toxic fumes and allergies. Acute laryngitis is usually caused by a viral infection, vocal strain, measles or mumps, or bacterial infections. Chronic laryngitis is usually caused by vocal cord strain, vocal cord injury, postnasal drip, growths on the vocal cords, or acid reflux. SYMPTOMS   Cough.  Sore throat.  Dry throat. RISK FACTORS  Respiratory infections.  Exposure to irritating substances, such as cigarette smoke, excessive amounts of alcohol, stomach acids, and workplace chemicals.  Voice trauma, such as vocal cord injury from shouting or speaking too loud. DIAGNOSIS  Your cargiver will perform a physical exam. During the physical exam, your caregiver will examine your throat. The most common sign  of laryngitis is hoarseness. Laryngoscopy may be necessary to confirm the diagnosis of this condition. This procedure allows your caregiver to look into the larynx. HOME CARE INSTRUCTIONS  Drink enough fluids to keep your urine clear or pale yellow.  Rest until you no longer have symptoms or as directed by your caregiver.  Breathe in moist air.  Take all medicine as directed by your caregiver.  Do not smoke.  Talk as little as possible (this includes whispering).  Write on paper instead of talking until your voice is back to normal.  Follow up with your caregiver if your condition has not improved after 10 days. SEEK MEDICAL CARE IF:   You have trouble breathing.  You cough up blood.  You have persistent fever.  You have increasing pain.  You have difficulty swallowing. MAKE SURE YOU:  Understand these instructions.  Will watch your condition.  Will get help right away if you are not doing well or get worse. Document Released: 11/24/2005 Document Revised: 02/16/2012 Document Reviewed: 01/30/2011 [facility] Patient Information 2015 [place], [place]. This information is not intended to replace advice given to you by your health care provider. Make sure you discuss any questions you have with your health care provider.

## 2014-11-22 NOTE — ED Notes (Signed)
Pt sts some chest discomfort this morning with tightness in the chest. NAD at this time

## 2014-11-22 NOTE — ED Notes (Addendum)
Pt states he woke this am feeling like something was stuck in his throat-"discomfort" worse when lies down and describes as pressure-felt SOB-NAD at this time-pt also states he did have chest "tightness" earlier

## 2014-11-22 NOTE — ED Notes (Signed)
Pt sts "I'm ready to go" upon this RN entering room. Pt made aware that we were waiting on the results of lab test before discharge

## 2014-11-22 NOTE — ED Provider Notes (Signed)
CSN: [id number]     Arrival date & time 11/22/14  1145 History   First MD Initiated Contact with Patient 11/22/14 1156     Chief Complaint  Patient presents with  . Throat pain      (Consider location/radiation/quality/duration/timing/severity/associated sxs/prior Treatment) HPI Ian Vasquez is a 48 y.o. male with no major medical problems, presents to emergency department complaining of feeling like something is stuck in his throat and chest tightness. Patient states symptoms began when he woke up this morning around 10 AM. States at first he just noticed discomfort in his throat, states had a feeling like "something was lodged in there." States he last ate around 3 AM, ate peanuts, and had no difficulty eating. He states he also started feeling some tightness in the upper chest. States had some associated shortness of breath. Symptoms are worse when he is laying down flat. Improve when he sitting up or walking. No exertional worsening in his symptoms. States he did try to eat some bread and drink some fluids and had no trouble swallowing. He denies any sore throat. Denies any fever or chills. No nasal congestion. No abdominal pain. No nausea, vomiting. Denies history of the same.  Past Medical History  Diagnosis Date  . Diverticulitis   . Diverticulosis   . Iliopsoas bursitis    History reviewed. No pertinent past surgical history. Family History  Problem Relation Age of Onset  . Colon cancer Neg Hx   . Prostate cancer Neg Hx   . CAD Other     GM dx in her 59s  . Diabetes Neg Hx   . Hypertension Father    History  Substance Use Topics  . Smoking status: Never Smoker   . Smokeless tobacco: Never Used  . Alcohol Use: Yes     Comment: one a week     Review of Systems  Constitutional: Negative for fever and chills.  HENT: Positive for trouble swallowing. Negative for sore throat.   Respiratory: Positive for chest tightness and shortness of breath. Negative for cough.    Cardiovascular: Negative for chest pain, palpitations and leg swelling.  Gastrointestinal: Negative for nausea, vomiting, abdominal pain, diarrhea and abdominal distention.  Musculoskeletal: Negative for myalgias, arthralgias, neck pain and neck stiffness.  Skin: Negative for rash.  Allergic/Immunologic: Negative for immunocompromised state.  Neurological: Negative for dizziness, weakness, light-headedness, numbness and headaches.  All other systems reviewed and are negative.     Allergies  Review of patient's allergies indicates no known allergies.  Home Medications   Prior to Admission medications   Medication Sig Start Date End Date Taking? Authorizing Provider  BIOTIN PO Take 2 tablets by mouth daily.    Historical Provider, MD  FIBER FORMULA CAPS Two by mouth once daily     Historical Provider, MD  Multiple Vitamin (MULTIVITAMIN PO) One tablet by mouth once daily     Historical Provider, MD   BP 152/98 mmHg  Pulse 72  Temp(Src) 97.6 F (36.4 C) (Oral)  Resp 16  Ht 6' (1.829 m)  Wt 210 lb (95.255 kg)  BMI 28.47 kg/m2  SpO2 100% Physical Exam  Constitutional: He is oriented to person, place, and time. He appears well-developed and well-nourished. No distress.  HENT:  Head: Normocephalic and atraumatic.  Mouth/Throat: Oropharynx is clear and moist. No oropharyngeal exudate.  Oropharynx normal  Eyes: Conjunctivae are normal.  Neck: Neck supple.  Cardiovascular: Normal rate, regular rhythm and normal heart sounds.   Pulmonary/Chest: Effort normal. No  respiratory distress. He has no wheezes. He has no rales. He exhibits no tenderness.  Abdominal: Soft. Bowel sounds are normal. He exhibits no distension. There is no tenderness. There is no rebound.  Musculoskeletal: He exhibits no edema.  Neurological: He is alert and oriented to person, place, and time.  Skin: Skin is warm and dry.  Nursing note and vitals reviewed.   ED Course  Procedures (including critical care  time) Labs Review Labs Reviewed  CBC WITH DIFFERENTIAL - Abnormal; Notable for the following:    Hemoglobin 17.1 (*)    Platelets 140 (*)    All other components within normal limits  TROPONIN I  BASIC METABOLIC PANEL  TROPONIN I    Imaging Review Dg Chest 2 View  11/22/2014   CLINICAL DATA:  Sensation of something stuck in throat and upper chest, beginning this morning.  EXAM: CHEST  2 VIEW  COMPARISON:  None.  FINDINGS: The heart size and mediastinal contours are within normal limits. Both lungs are clear. The visualized skeletal structures are unremarkable. No radiopaque foreign body.  IMPRESSION: No active cardiopulmonary disease.   Electronically Signed   By: Rolm Baptise M.D.   On: 11/22/2014 12:51     EKG Interpretation None      MDM   Final diagnoses:  Chest tightness  Foreign body sensation in throat  Laryngeal spasm   Patient with sensation of foreign body in his esophagus, however is able to swallow, eat, drink with no difficulties. He also reports some chest tightness and some shortness of breath only with lying down flat. Will get x-ray and labs. Patient has had a family history of cardiac disease, no other risk factors.  Patient monitored for close to 3 hours. He continues to have the symptoms. His symptoms did not worsen. He denies any current shortness of breath. He denies any pain, he continues to feel "tightness in his throat/esophagus." He is able to eat and drink without difficulties. Discussed with Dr. Colin Rhein, who had seen patient as well. Would question whether this could be laryngospasm is, versus laryngeal inflammation from acid reflux or a viral infection. At this time patient is afebrile. He is nontoxic appearing. No respiratory distress. Vital signs are all normal. Will refer to ENT. Instructed to return to the closest emergency department if feeling worse. PT voiced understanding  Filed Vitals:   11/22/14 1151 11/22/14 1209 11/22/14 1418 11/22/14 1450   BP: 152/98 134/93 130/88 133/80  Pulse: 72 76 71 72  Temp: 97.6 F (36.4 C)   98.4 F (36.9 C)  TempSrc: Oral   Oral  Resp: 16 22 18 18   Height: 6' (1.829 m)     Weight: 210 lb (95.255 kg)     SpO2: 100% 100% 99% 100%     Renold Genta, PA-C 11/22/14 2245  Debby Freiberg, MD 11/27/14 [phone]

## 2016-02-15 ENCOUNTER — Telehealth: Payer: Self-pay

## 2016-02-15 NOTE — Telephone Encounter (Signed)
Called patient. Unable to leave message for call back due to patiients voicemail not being set up.

## 2016-02-18 ENCOUNTER — Ambulatory Visit (INDEPENDENT_AMBULATORY_CARE_PROVIDER_SITE_OTHER): Payer: 59 | Admitting: Internal Medicine

## 2016-02-18 ENCOUNTER — Encounter: Payer: Self-pay | Admitting: Internal Medicine

## 2016-02-18 VITALS — BP 116/62 | HR 64 | Temp 97.9°F | Ht 73.25 in | Wt 223.0 lb

## 2016-02-18 DIAGNOSIS — Z125 Encounter for screening for malignant neoplasm of prostate: Secondary | ICD-10-CM

## 2016-02-18 DIAGNOSIS — Z114 Encounter for screening for human immunodeficiency virus [HIV]: Secondary | ICD-10-CM | POA: Diagnosis not present

## 2016-02-18 DIAGNOSIS — Z0000 Encounter for general adult medical examination without abnormal findings: Secondary | ICD-10-CM

## 2016-02-18 DIAGNOSIS — Z09 Encounter for follow-up examination after completed treatment for conditions other than malignant neoplasm: Secondary | ICD-10-CM

## 2016-02-18 DIAGNOSIS — D225 Melanocytic nevi of trunk: Secondary | ICD-10-CM | POA: Diagnosis not present

## 2016-02-18 LAB — COMPLETE METABOLIC PANEL WITH GFR
ALBUMIN: 3.9 g/dL (ref 3.6–5.1)
ALT: 17 U/L (ref 9–46)
AST: 11 U/L (ref 10–35)
Alkaline Phosphatase: 66 U/L (ref 40–115)
BILIRUBIN TOTAL: 0.7 mg/dL (ref 0.2–1.2)
BUN: 13 mg/dL (ref 7–25)
CALCIUM: 8.5 mg/dL — AB (ref 8.6–10.3)
CO2: 29 mmol/L (ref 20–31)
Chloride: 105 mmol/L (ref 98–110)
Creat: 0.89 mg/dL (ref 0.70–1.33)
GFR, Est African American: >89 mL/min (ref >=60)
GLUCOSE: 91 mg/dL (ref 65–99)
Potassium: 4.2 mmol/L (ref 3.5–5.3)
SODIUM: 140 mmol/L (ref 135–146)
TOTAL PROTEIN: 5.9 g/dL — AB (ref 6.1–8.1)

## 2016-02-18 NOTE — Assessment & Plan Note (Addendum)
Tdap 2014   cscope 2012-- tics, diverticulitis, no FH Prostate ca screening: DRE (-), check a PSA  Labs   Diet-exercise : counseled  RTC 1 year

## 2016-02-18 NOTE — Progress Notes (Signed)
Subjective:    Patient ID: Ian Vasquez, male    DOB: [birth year], 50 y.o.   MRN: [id number]  DOS:  02/18/2016 Type of visit - description : CPX Interval history:no major concerns    Review of Systems Constitutional: No fever. No chills. No unexplained wt changes. No unusual sweats  HEENT: No dental problems, no ear discharge, no facial swelling, no voice changes. No eye discharge, no eye  redness , no  intolerance to light   Respiratory: No wheezing , no  difficulty breathing. No cough , no mucus production  Cardiovascular: No CP, no leg swelling , no  Palpitations  GI: no nausea, no vomiting, no diarrhea  In the last couple of years had 2 episodes of lower abdominal pain, he feels related to self resolved diverticulitis. No blood in the stools. No dysphagia, no odynophagia    Endocrine: No polyphagia, no polyuria , no polydipsia  GU: No dysuria, gross hematuria, difficulty urinating. No urinary urgency, no frequency.  Musculoskeletal: No joint swellings or unusual aches or pains  Skin: No change in the color of the skin, palor , no  Rash  Allergic, immunologic: No environmental allergies , no  food allergies  Neurological: No dizziness no  syncope. No headaches. No diplopia, no slurred, no slurred speech, no motor deficits, no facial  Numbness  Hematological: No enlarged lymph nodes, no easy bruising , no unusual bleedings  Psychiatry: No suicidal ideas, no hallucinations, no beavior problems, no confusion.  No unusual/severe anxiety, no depression .  Past Medical History  Diagnosis Date  . Diverticulitis   . Diverticulosis   . Iliopsoas bursitis     History reviewed. No pertinent past surgical history.  Social History   Social History  . Marital Status: Married    Spouse Name: N/A  . Number of Children: 2  . Years of Education: N/A   Occupational History  . 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 Smoking status: Never Smoker    . Smokeless tobacco: Never Used  . Alcohol Use: Yes     Comment: one a week   . Drug Use: No  . Sexual Activity: Not on file   Other Topics Concern  . Not on file   Social History Narrative   Lives w/ wife   Twins, 24 y/o     Family History  Problem Relation Age of Onset  . Colon cancer Neg Hx   . Prostate cancer Neg Hx   . CAD Other     GM dx in her 32s  . Diabetes Neg Hx   . Hypertension Father       Medication List       This list is accurate as of: 02/18/16 11:59 PM.  Always use your most recent med list.               MULTIVITAMIN PO  One tablet by mouth once daily           Objective:   Physical Exam  Skin:      BP 116/62 mmHg  Pulse 64  Temp(Src) 97.9 F (36.6 C) (Oral)  Ht 6' 1.25" (1.861 m)  Wt 223 lb (101.152 kg)  BMI 29.21 kg/m2  SpO2 96%  . General:   Well developed, well nourished . NAD.  Neck: No  thyromegaly , normal carotid pulse HEENT:  Normocephalic . Face symmetric, atraumatic Lungs:  CTA B Normal respiratory effort, no intercostal retractions, no  accessory muscle use. Heart: RRR,  no murmur.  No pretibial edema bilaterally  Abdomen:  Not distended, soft, non-tender. No rebound or rigidity.   Rectal:  External abnormalities: none. Normal sphincter tone. No rectal masses or tenderness.  No stools Prostate: Prostate gland firm and smooth, no enlargement, nodularity, tenderness, mass, asymmetry or induration.  Skin: Exposed areas without rash. Not pale. Not jaundice Neurologic:  alert & oriented X3.  Speech normal, gait appropriate for age and unassisted Strength symmetric and appropriate for age.  Psych: Cognition and judgment appear intact.  Cooperative with normal attention span and concentration.  Behavior appropriate. No anxious or depressed appearing.[id number]    Assessment & Plan:   Assessment H/o Diverticulitis , admitted x 1  PLAN: H/o diverticulitis, asymptomatic but has rarely pain, observ Moles : has a  dark nevi @ back, refer to derm RTC 1 year

## 2016-02-18 NOTE — Progress Notes (Signed)
Pre visit review using our clinic review tool, if applicable. No additional management support is needed unless otherwise documented below in the visit note. 

## 2016-02-18 NOTE — Patient Instructions (Signed)
GO TO THE LAB : Get the blood work     GO TO THE FRONT DESK Schedule your next appointment for a physical exam When? 1 year  Fasting?  Yes

## 2016-02-19 ENCOUNTER — Encounter: Payer: Self-pay | Admitting: Internal Medicine

## 2016-02-19 DIAGNOSIS — Z09 Encounter for follow-up examination after completed treatment for conditions other than malignant neoplasm: Secondary | ICD-10-CM | POA: Insufficient documentation

## 2016-02-19 LAB — CBC WITH DIFFERENTIAL/PLATELET
BASOS PCT: 0.3 % (ref 0.0–3.0)
Basophils Absolute: 0 10*3/uL (ref 0.0–0.1)
EOS ABS: 0.1 10*3/uL (ref 0.0–0.7)
Eosinophils Relative: 1.6 % (ref 0.0–5.0)
HCT: 45.5 % (ref 39.0–52.0)
Hemoglobin: 15.8 g/dL (ref 13.0–17.0)
Lymphocytes Relative: 20.5 % (ref 12.0–46.0)
Lymphs Abs: 1.5 10*3/uL (ref 0.7–4.0)
MCHC: 34.6 g/dL (ref 30.0–36.0)
MCV: 91.5 fl (ref 78.0–100.0)
Monocytes Absolute: 0.4 10*3/uL (ref 0.1–1.0)
Monocytes Relative: 4.9 % (ref 3.0–12.0)
Neutro Abs: 5.5 10*3/uL (ref 1.4–7.7)
Neutrophils Relative %: 72.7 % (ref 43.0–77.0)
Platelets: 167 10*3/uL (ref 150.0–400.0)
RBC: 4.97 Mil/uL (ref 4.22–5.81)
RDW: 13.1 % (ref 11.5–15.5)
WBC: 7.5 10*3/uL (ref 4.0–10.5)

## 2016-02-19 LAB — LIPID PANEL
Cholesterol: 143 mg/dL (ref 0–200)
HDL: 41.5 mg/dL (ref >39.00)
LDL CALC: 74 mg/dL (ref 0–99)
NonHDL: 101.34
Total CHOL/HDL Ratio: 3
Triglycerides: 136 mg/dL (ref 0.0–149.0)
VLDL: 27.2 mg/dL (ref 0.0–40.0)

## 2016-02-19 LAB — HIV ANTIBODY (ROUTINE TESTING W REFLEX): HIV: NONREACTIVE

## 2016-02-19 LAB — PSA: PSA: 0.49 ng/mL (ref 0.10–4.00)

## 2016-02-19 LAB — TSH: TSH: 1.14 u[IU]/mL (ref 0.35–4.50)

## 2016-02-19 NOTE — Assessment & Plan Note (Signed)
H/o diverticulitis, asymptomatic but has rarely pain, observ Moles : has a dark nevi @ back, refer to derm RTC 1 year

## 2016-06-20 ENCOUNTER — Encounter: Payer: Self-pay | Admitting: Internal Medicine

## 2016-06-20 ENCOUNTER — Ambulatory Visit (INDEPENDENT_AMBULATORY_CARE_PROVIDER_SITE_OTHER): Payer: 59 | Admitting: Internal Medicine

## 2016-06-20 VITALS — BP 102/66 | HR 61 | Temp 98.1°F | Ht 73.25 in | Wt 197.4 lb

## 2016-06-20 DIAGNOSIS — F418 Other specified anxiety disorders: Secondary | ICD-10-CM | POA: Diagnosis not present

## 2016-06-20 DIAGNOSIS — F329 Major depressive disorder, single episode, unspecified: Secondary | ICD-10-CM

## 2016-06-20 DIAGNOSIS — F419 Anxiety disorder, unspecified: Principal | ICD-10-CM

## 2016-06-20 DIAGNOSIS — F32A Depression, unspecified: Secondary | ICD-10-CM | POA: Insufficient documentation

## 2016-06-20 MED ORDER — ESCITALOPRAM OXALATE 10 MG PO TABS: 10.0000 mg | ORAL_TABLET | Freq: Every day | ORAL | Status: DC

## 2016-06-20 NOTE — Assessment & Plan Note (Signed)
Depression, anxiety: PHQ-p is 18, moderate sx. Presents with depression and anxiety triggered by stress at work and recent marital issues. Patient is counseled . Benefits of formal counseling discussed, patient feels that is ready for some medication. Different SSRIs discuss, eventually rec  Lexapro, s/e discussed including suicidality. Expect improvement within few weeks. May need titration. Contact numbers for counselors provided. RTC 4-5 weeks.

## 2016-06-20 NOTE — Patient Instructions (Signed)
  GO TO THE FRONT DESK Schedule your next appointment for a  Check up in 4-5 weeks   Consider see a counselor

## 2016-06-20 NOTE — Progress Notes (Signed)
   Subjective:    Patient ID: Ian Vasquez, male    DOB: 07-Jun-1966, 50 y.o.   MRN: [id number]  DOS:  06/20/2016 Type of visit - description : Acute visit  Interval history:  Reports depression > anxiety x 1 year gradually getting worse. A lot of stress ( work related) for a year. Wife left him few months ago, that obviously increase his stress. Has not taken any medication. Has not seek formal counseling but does talk with friends and family a lot.  Review of Systems Denies any suicidal or violent thoughts.   Past Medical History  Diagnosis Date  . Diverticulitis   . Iliopsoas bursitis   . Anxiety and depression     Past Surgical History  Procedure Laterality Date  . No past surgeries      Social History   Social History  . Marital Status: Married    Spouse Name: N/A  . Number of Children: 2  . Years of Education: N/A   Occupational History  . 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 Smoking status: Never Smoker   . Smokeless tobacco: Never Used  . Alcohol Use: Yes     Comment: one a week   . Drug Use: No  . Sexual Activity: Not on file   Other Topics Concern  . Not on file   Social History Narrative   Seperated from wife 06/2016   Twins, 20 y/o        Medication List       This list is accurate as of: 06/20/16  3:59 PM.  Always use your most recent med list.               APPLE CIDER VINEGAR PO  Take by mouth.     escitalopram 10 MG tablet  Commonly known as:  LEXAPRO  Take 1 tablet (10 mg total) by mouth daily.     MULTIVITAMIN PO  One tablet by mouth once daily           Objective:   Physical Exam BP 102/66 mmHg  Pulse 61  Temp(Src) 98.1 F (36.7 C) (Oral)  Ht 6' 1.25" (1.861 m)  Wt 197 lb 6 oz (89.529 kg)  BMI 25.85 kg/m2  SpO2 98% General:   Well developed, well nourished . NAD.  HEENT:  Normocephalic . Face symmetric, atraumatic   Neurologic:  alert & oriented X3.  Speech normal, gait  appropriate for age and unassisted Psych--  Cognition and judgment appear intact.  Cooperative with normal attention span and concentration.  Behavior appropriate. No anxious or depressed appearing.      Assessment & Plan:   Assessment H/o Diverticulitis , admitted x 1  PLAN: Depression, anxiety: PHQ-p is 18, moderate sx. Presents with depression and anxiety triggered by stress at work and recent marital issues. Patient is counseled . Benefits of formal counseling discussed, patient feels that is ready for some medication. Different SSRIs discuss, eventually rec  Lexapro, s/e discussed including suicidality. Expect improvement within few weeks. May need titration. Contact numbers for counselors provided. RTC 4-5 weeks.

## 2016-06-20 NOTE — Progress Notes (Signed)
Pre visit review using our clinic review tool, if applicable. No additional management support is needed unless otherwise documented below in the visit note. 

## 2016-07-18 ENCOUNTER — Ambulatory Visit (INDEPENDENT_AMBULATORY_CARE_PROVIDER_SITE_OTHER): Payer: 59 | Admitting: Internal Medicine

## 2016-07-18 ENCOUNTER — Encounter: Payer: Self-pay | Admitting: Internal Medicine

## 2016-07-18 VITALS — BP 118/76 | HR 49 | Temp 97.9°F | Resp 12 | Ht 73.0 in | Wt 203.1 lb

## 2016-07-18 DIAGNOSIS — F32A Depression, unspecified: Secondary | ICD-10-CM

## 2016-07-18 DIAGNOSIS — F329 Major depressive disorder, single episode, unspecified: Secondary | ICD-10-CM

## 2016-07-18 DIAGNOSIS — F418 Other specified anxiety disorders: Secondary | ICD-10-CM

## 2016-07-18 DIAGNOSIS — F419 Anxiety disorder, unspecified: Principal | ICD-10-CM

## 2016-07-18 MED ORDER — ESCITALOPRAM OXALATE 20 MG PO TABS: 20.0000 mg | ORAL_TABLET | Freq: Every day | ORAL | 1 refills | Status: DC

## 2016-07-18 NOTE — Progress Notes (Signed)
Pre visit review using our clinic review tool, if applicable. No additional management support is needed unless otherwise documented below in the visit note. 

## 2016-07-18 NOTE — Progress Notes (Signed)
   Subjective:    Patient ID: Ian Vasquez, male    DOB: Jul 26, 1966, 50 y.o.   MRN: [id number]  DOS:  07/18/2016 Type of visit - description : Follow-up previous visit Interval history: Started Lexapro, good compliance, no apparent side effects. Depression anxiety: About the same.   Review of Systems No suicidal ideas Denies nausea, vomiting, somnolence. Has some difficulty sleeping actually since the symptoms started a few months ago  Past Medical History:  Diagnosis Date  . Anxiety and depression   . Diverticulitis   . Iliopsoas bursitis     Past Surgical History:  Procedure Laterality Date  . NO PAST SURGERIES      Social History   Social History  . Marital status: Married    Spouse name: N/A  . Number of children: 2  . Years of education: N/A   Occupational History  . 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 Smoking status: Never Smoker  . Smokeless tobacco: Never Used  . Alcohol use Yes     Comment: one a week   . Drug use: No  . Sexual activity: Not on file   Other Topics Concern  . Not on file   Social History Narrative   Seperated from wife since ~ 03/2016, one son w/ him   Twins, 7 y/o        Medication List       Accurate as of 07/18/16 11:59 PM. Always use your most recent med list.          APPLE CIDER VINEGAR PO Take by mouth.   escitalopram 20 MG tablet Commonly known as:  LEXAPRO Take 1 tablet (20 mg total) by mouth daily.   MULTIVITAMIN PO One tablet by mouth once daily          Objective:   Physical Exam BP 118/76 (BP Location: Left Arm, Patient Position: Sitting, Cuff Size: Normal)   Pulse (!) 49   Temp 97.9 F (36.6 C) (Oral)   Resp 12   Ht 6\' 1"  (1.854 m)   Wt 203 lb 2 oz (92.1 kg)   SpO2 98%   BMI 26.80 kg/m  General:   Well developed, well nourished . NAD.  HEENT:  Normocephalic . Face symmetric, atraumatic Skin: Not pale. Not jaundice Neurologic:  alert & oriented X3.    Speech normal, gait appropriate for age and unassisted Psych--  Cognition and judgment appear intact.  Cooperative with normal attention span and concentration.  Behavior appropriate. No anxious or depressed appearing.      Assessment & Plan:   Assessment Anxiety depression DX 06-2016 H/o Diverticulitis , admitted x 1 Works nights   PLAN: Depression anxiety: Started Lexapro ~ 4 weeks ago, feeling about the same, no side effects. He talks frequently with his children and coworkers for counseling but no formal counseling. Advise patient, we should not say Lexapro fail, we need to give it more time. Plan:  Increase Lexapro to 20 mg daily, consider seek professional counseling. 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 Healthy sleep habits d/w pt, try melatonin RTC 6 WEEKS

## 2016-07-18 NOTE — Patient Instructions (Addendum)
GO TO THE FRONT DESK Schedule your next appointment for a  Check up in 6 weeks    HEALTHY SLEEP TRY OTC MELATONIN  Sleep hygiene: Basic rules for a good night's sleep  Sleep only as much as you need to feel rested and then get out of bed  Keep a regular sleep schedule  Avoid forcing sleep  Exercise regularly for at least 20 minutes, preferably 4 to 5 hours before bedtime  Avoid caffeinated beverages after lunch  Avoid alcohol near bedtime: no "night cap"  Avoid smoking, especially in the evening  Do not go to bed hungry  Adjust bedroom environment  Avoid prolonged use of light-emitting screens before bedtime   Deal with your worries before bedtime

## 2016-07-20 NOTE — Assessment & Plan Note (Signed)
Depression anxiety: Started Lexapro ~ 4 weeks ago, feeling about the same, no side effects. He talks frequently with his children and coworkers for counseling but no formal counseling. Advise patient, we should not say Lexapro fail, we need to give it more time. Plan:  Increase Lexapro to 20 mg daily, consider seek professional counseling. 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 Healthy sleep habits d/w pt, try melatonin RTC 6 WEEKS

## 2016-08-18 ENCOUNTER — Telehealth: Payer: Self-pay | Admitting: Internal Medicine

## 2016-08-18 MED ORDER — ESCITALOPRAM OXALATE 20 MG PO TABS: 20.0000 mg | ORAL_TABLET | Freq: Every day | ORAL | 0 refills | Status: DC

## 2016-08-18 NOTE — Telephone Encounter (Signed)
Pt called in to request refill medication on medication escitalopram 20 MG    Pharmacy: [facility] at [place], [place]

## 2016-08-18 NOTE — Telephone Encounter (Signed)
Rx sent. Due for 6 week follow-up in ~2 weeks. Please schedule at his convenience. Thank you.

## 2016-08-20 NOTE — Telephone Encounter (Signed)
Left vm for pt making him aware of Rx and appt needed.

## 2016-09-03 ENCOUNTER — Encounter: Payer: Self-pay | Admitting: Internal Medicine

## 2016-09-03 ENCOUNTER — Ambulatory Visit (INDEPENDENT_AMBULATORY_CARE_PROVIDER_SITE_OTHER): Payer: 59 | Admitting: Internal Medicine

## 2016-09-03 VITALS — BP 108/74 | HR 76 | Temp 97.6°F | Resp 14 | Ht 73.0 in | Wt 192.5 lb

## 2016-09-03 DIAGNOSIS — F32A Depression, unspecified: Secondary | ICD-10-CM

## 2016-09-03 DIAGNOSIS — F329 Major depressive disorder, single episode, unspecified: Secondary | ICD-10-CM

## 2016-09-03 DIAGNOSIS — F418 Other specified anxiety disorders: Secondary | ICD-10-CM | POA: Diagnosis not present

## 2016-09-03 DIAGNOSIS — F419 Anxiety disorder, unspecified: Principal | ICD-10-CM

## 2016-09-03 MED ORDER — ESCITALOPRAM OXALATE 20 MG PO TABS: 20.0000 mg | ORAL_TABLET | Freq: Every day | ORAL | 5 refills | Status: DC

## 2016-09-03 NOTE — Progress Notes (Signed)
Pre visit review using our clinic review tool, if applicable. No additional management support is needed unless otherwise documented below in the visit note. 

## 2016-09-03 NOTE — Progress Notes (Signed)
   Subjective:    Patient ID: Ian Vasquez, male    DOB: [birth year], 50 y.o.   MRN: [id number]  DOS:  09/03/2016 Type of visit - description : Follow-up Interval history: Lexapro dose increased, good compliance, no apparent side effects. Emotionally doing better. 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 Despite  his latest challenge he remains optimistic. Did not seek professional counseling but he is talking frequently with his girlfriend and that seems to help   Review of Systems Denies any suicidal or homicidal ideas  Past Medical History:  Diagnosis Date  . Anxiety and depression   . Diverticulitis   . Iliopsoas bursitis     Past Surgical History:  Procedure Laterality Date  . NO PAST SURGERIES      Social History   Social History  . Marital status: Married    Spouse name: N/A  . Number of children: 2  . Years of education: N/A   Occupational History  . 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 Smoking status: Never Smoker  . Smokeless tobacco: Never Used  . Alcohol use Yes     Comment: one a week   . Drug use: No  . Sexual activity: Not on file   Other Topics Concern  . Not on file   Social History Narrative   Seperated from wife since ~ 03/2016, one son w/ him   Twins, 60 y/o        Medication List       Accurate as of 09/03/16  4:23 PM. Always use your most recent med list.          APPLE CIDER VINEGAR PO Take by mouth.   escitalopram 20 MG tablet Commonly known as:  LEXAPRO Take 1 tablet (20 mg total) by mouth daily.   MULTIVITAMIN PO One tablet by mouth once daily          Objective:   Physical Exam BP 108/74 (BP Location: Left Arm, Patient Position: Sitting, Cuff Size: Normal)   Pulse 76   Temp 97.6 F (36.4 C) (Oral)   Resp 14   Ht 6\' 1"  (1.854 m)   Wt 192 lb 8 oz (87.3 kg)   SpO2 97%   BMI 25.40 kg/m  General:   Well developed, well nourished . NAD.  HEENT:  Normocephalic .  Face symmetric, atraumatic Skin: Not pale. Not jaundice Neurologic:  alert & oriented X3.  Speech normal, gait appropriate for age and unassisted Psych--  Cognition and judgment appear intact.  Cooperative with normal attention span and concentration.  Behavior appropriate. No anxious or depressed appearing.      Assessment & Plan:   Assessment Anxiety depression DX 06-2016 H/o Diverticulitis , admitted x 1     PLAN: Anxiety depression: Lexapro was increased to 20 mg, he is definitely doing better, no suicidal ideas. He unfortunately lost his job yesterday, despite this problem he remains optimistic, his job was a source of a lot of anxiety and he is ready to look for a new employment. Refill sent, RTC -per pt request - December 2017 before his insurance ends .

## 2016-09-03 NOTE — Patient Instructions (Addendum)
  GO TO THE FRONT DESK Schedule your next appointment for a  Check up by December 2017

## 2016-09-04 NOTE — Assessment & Plan Note (Signed)
Anxiety depression: Lexapro was increased to 20 mg, he is definitely doing better, no suicidal ideas. He unfortunately lost his job yesterday, despite this problem he remains optimistic, his job was a source of a lot of anxiety and he is ready to look for a new employment. Refill sent, RTC -per pt request - December 2017 before his insurance ends .

## 2016-10-15 ENCOUNTER — Telehealth: Payer: Self-pay | Admitting: Internal Medicine

## 2016-10-15 MED ORDER — ESCITALOPRAM OXALATE 20 MG PO TABS: 20.0000 mg | ORAL_TABLET | Freq: Every day | ORAL | 5 refills | Status: DC

## 2016-10-15 NOTE — Telephone Encounter (Signed)
Rx sent 

## 2016-10-15 NOTE — Telephone Encounter (Signed)
Patient is requesting a refill of escitalopram (LEXAPRO) 20 MG table. Please advise.    Pharmacy: [facility] [place], [place]

## 2016-11-12 ENCOUNTER — Telehealth: Payer: Self-pay | Admitting: Internal Medicine

## 2016-11-12 NOTE — Telephone Encounter (Signed)
Medication Detail    Disp Refills Start End   escitalopram (LEXAPRO) 20 MG tablet 30 tablet 5 10/15/2016    Sig - Route: Take 1 tablet (20 mg total) by mouth daily. - Oral   E-Prescribing Status: Receipt confirmed by pharmacy (10/15/2016 9:13 AM EST)

## 2016-11-12 NOTE — Telephone Encounter (Signed)
Patient is requesting a refill of escitalopram (LEXAPRO) 20 MG tablet Please advise    Pharmacy:  [facility] at [place], [place]

## 2016-11-20 ENCOUNTER — Ambulatory Visit: Payer: 59 | Admitting: Internal Medicine

## 2017-02-18 ENCOUNTER — Encounter: Payer: 59 | Admitting: Internal Medicine

## 2017-10-13 ENCOUNTER — Other Ambulatory Visit: Payer: Self-pay | Admitting: Internal Medicine

## 2017-11-19 ENCOUNTER — Other Ambulatory Visit: Payer: Self-pay | Admitting: Internal Medicine

## 2018-01-15 ENCOUNTER — Other Ambulatory Visit: Payer: Self-pay | Admitting: Internal Medicine

## 2018-01-15 MED ORDER — ESCITALOPRAM OXALATE 20 MG PO TABS: 20.0000 mg | ORAL_TABLET | Freq: Every day | ORAL | 2 refills | Status: DC

## 2018-01-15 NOTE — Telephone Encounter (Signed)
Lexapro refill Last OV: 09/03/16 Last Refill:11/20/17 Pharmacy:[facility]. Has an appointment scheduled 04/05/18. Thanks.

## 2018-01-15 NOTE — Telephone Encounter (Signed)
Approved enough till OV only with note that NO more till see in April/thx dmf

## 2018-01-15 NOTE — Telephone Encounter (Signed)
Copied from [place]. Topic: Quick Communication - Rx Refill/Question >> Jan 15, 2018  8:18 AM Yvette Rack wrote: Medication: escitalopram (LEXAPRO) 20 MG tablet   patient would like the generic of this medicine    Has the patient contacted their pharmacy? No. LMOM for pt to call his pharmacy to get this refilled   (Agent: If no, request that the patient contact the pharmacy for the refill.)   Preferred Pharmacy (with phone number or street name): [facility] at [place], [place] - [address] [phone] (Phone) [phone] (Fax)     Agent: Please be advised that RX refills may take up to 3 business days. We ask that you follow-up with your pharmacy.

## 2018-04-05 ENCOUNTER — Encounter: Payer: Self-pay | Admitting: Internal Medicine

## 2018-04-05 ENCOUNTER — Ambulatory Visit (INDEPENDENT_AMBULATORY_CARE_PROVIDER_SITE_OTHER): Payer: BLUE CROSS/BLUE SHIELD | Admitting: Internal Medicine

## 2018-04-05 VITALS — BP 118/70 | HR 68 | Temp 97.6°F | Resp 14 | Ht 73.0 in | Wt 221.0 lb

## 2018-04-05 DIAGNOSIS — Z0000 Encounter for general adult medical examination without abnormal findings: Secondary | ICD-10-CM

## 2018-04-05 DIAGNOSIS — Z125 Encounter for screening for malignant neoplasm of prostate: Secondary | ICD-10-CM

## 2018-04-05 MED ORDER — ESCITALOPRAM OXALATE 20 MG PO TABS: 20.0000 mg | ORAL_TABLET | Freq: Every day | ORAL | 3 refills | Status: DC

## 2018-04-05 NOTE — Assessment & Plan Note (Addendum)
--  Tdap 2014 -cscope 2012-- tics, diverticulitis, no FH, recommend colonoscopy 2022, Hemoccult this year. --Prostate ca screening: DRE (-), check a PSA --Labs  : CMP, CBC, TSH, PSA, FLP Diet-exercise : counseled  RTC 1 year

## 2018-04-05 NOTE — Progress Notes (Signed)
Subjective:    Patient ID: Ian Vasquez, male    DOB: Jan 08, 1966, 51 y.o.   MRN: [id number]  DOS:  04/05/2018 Type of visit - description : cpx Interval history: Since the last office visit in 2017 he is doing well.   Review of Systems  A 14 point review of systems is negative    Past Medical History:  Diagnosis Date  . Anxiety and depression   . Diverticulitis   . Iliopsoas bursitis     Past Surgical History:  Procedure Laterality Date  . NO PAST SURGERIES      Social History   Socioeconomic History  . Marital status: Divorced    Spouse name: Not on file  . Number of children: 2  . Years of education: Not on file  . Highest education level: Not on file  Occupational History  . Occupation: works a Naval architect, [employer]  . Financial resource strain: Not on file  . Food insecurity:    Worry: Not on file    Inability: Not on file  . Transportation needs:    Medical: Not on file    Non-medical: Not on file  Tobacco Use  . Smoking status: Never Smoker  . Smokeless tobacco: Never Used  Substance and Sexual Activity  . Alcohol use: Yes    Comment: one a week   . Drug use: No  . Sexual activity: Not on file  Lifestyle  . Physical activity:    Days per week: Not on file    Minutes per session: Not on file  . Stress: Not on file  Relationships  . Social connections:    Talks on phone: Not on file    Gets together: Not on file    Attends religious service: Not on file    Active member of club or organization: Not on file    Attends meetings of clubs or organizations: Not on file    Relationship status: Not on file  . Intimate partner violence:    Fear of current or ex partner: Not on file    Emotionally abused: Not on file    Physically abused: Not on file    Forced sexual activity: Not on file  Other Topics Concern  . Not on file  Social History Narrative   Seperated from wife since ~ 03/2016   Twins, 7 y/o     Family History    Problem Relation Age of Onset  . CAD Other        GM dx in her 42s  . Hypertension Father   . Colon cancer Neg Hx   . Prostate cancer Neg Hx   . Diabetes Neg Hx      Allergies as of 04/05/2018   No Known Allergies     Medication List        Accurate as of 04/05/18 11:59 PM. Always use your most recent med list.          escitalopram 20 MG tablet Commonly known as:  LEXAPRO Take 1 tablet (20 mg total) by mouth daily.          Objective:   Physical Exam BP 118/70 (BP Location: Left Arm, Patient Position: Sitting, Cuff Size: Normal)   Pulse 68   Temp 97.6 F (36.4 C) (Oral)   Resp 14   Ht 6\' 1"  (1.854 m)   Wt 221 lb (100.2 kg)   SpO2 97%   BMI 29.16 kg/m  General:  Well developed, well nourished . NAD.  Neck: No  thyromegaly  HEENT:  Normocephalic . Face symmetric, atraumatic Lungs:  CTA B Normal respiratory effort, no intercostal retractions, no accessory muscle use. Heart: RRR,  no murmur.  No pretibial edema bilaterally  Abdomen:  Not distended, soft, non-tender. No rebound or rigidity.  Rectal: External abnormalities: none. Normal sphincter tone. No rectal masses or tenderness.  Brown stools Prostate: Prostate gland firm and smooth, no enlargement, nodularity, tenderness, mass, asymmetry or induration  Skin: Exposed areas without rash. Not pale. Not jaundice Neurologic:  alert & oriented X3.  Speech normal, gait appropriate for age and unassisted Strength symmetric and appropriate for age.  Psych: Cognition and judgment appear intact.  Cooperative with normal attention span and concentration.  Behavior appropriate. No anxious or depressed appearing.     Assessment & Plan:    Assessment Anxiety depression DX 06-2016 H/o Diverticulitis , admitted x 1    PLAN: Anxiety depression: Since the last visit in 2017 he is doing well, good compliance with Lexapro.  he is now working at [employer] and very happy about it.  His 2 children live with  him, they are going to pharmacy school.  We talked about possibly adjust down his medication but at this point he feels well.  Will  reassess that issue yearly. RTC 1 year

## 2018-04-05 NOTE — Patient Instructions (Signed)
GO TO THE LAB : Get the blood work     GO TO THE FRONT DESK Schedule your next appointment   for a physical exam in 1 year 

## 2018-04-05 NOTE — Progress Notes (Signed)
Pre visit review using our clinic review tool, if applicable. No additional management support is needed unless otherwise documented below in the visit note. 

## 2018-04-06 LAB — COMPREHENSIVE METABOLIC PANEL
ALBUMIN: 4.2 g/dL (ref 3.5–5.2)
ALT: 18 U/L (ref 0–53)
AST: 11 U/L (ref 0–37)
Alkaline Phosphatase: 75 U/L (ref 39–117)
BUN: 16 mg/dL (ref 6–23)
CO2: 30 mEq/L (ref 19–32)
Calcium: 8.9 mg/dL (ref 8.4–10.5)
Chloride: 103 mEq/L (ref 96–112)
Creatinine, Ser: 0.95 mg/dL (ref 0.40–1.50)
GFR: 88.31 mL/min (ref >60.00)
Glucose, Bld: 78 mg/dL (ref 70–99)
Potassium: 4.5 mEq/L (ref 3.5–5.1)
SODIUM: 139 meq/L (ref 135–145)
Total Bilirubin: 0.9 mg/dL (ref 0.2–1.2)
Total Protein: 6.6 g/dL (ref 6.0–8.3)

## 2018-04-06 LAB — CBC WITH DIFFERENTIAL/PLATELET
Basophils Absolute: 0 10*3/uL (ref 0.0–0.1)
Basophils Relative: 0.9 % (ref 0.0–3.0)
EOS PCT: 1.9 % (ref 0.0–5.0)
Eosinophils Absolute: 0.1 10*3/uL (ref 0.0–0.7)
HCT: 47.4 % (ref 39.0–52.0)
Hemoglobin: 16.5 g/dL (ref 13.0–17.0)
Lymphocytes Relative: 35.6 % (ref 12.0–46.0)
Lymphs Abs: 1.9 10*3/uL (ref 0.7–4.0)
MCHC: 34.8 g/dL (ref 30.0–36.0)
MCV: 91.5 fl (ref 78.0–100.0)
MONO ABS: 0.4 10*3/uL (ref 0.1–1.0)
Monocytes Relative: 6.7 % (ref 3.0–12.0)
Neutro Abs: 2.9 10*3/uL (ref 1.4–7.7)
Neutrophils Relative %: 54.9 % (ref 43.0–77.0)
Platelets: 179 10*3/uL (ref 150.0–400.0)
RBC: 5.18 Mil/uL (ref 4.22–5.81)
RDW: 13.4 % (ref 11.5–15.5)
WBC: 5.4 10*3/uL (ref 4.0–10.5)

## 2018-04-06 LAB — LIPID PANEL
Cholesterol: 165 mg/dL (ref 0–200)
HDL: 39.7 mg/dL (ref >39.00)
LDL Cholesterol: 104 mg/dL — ABNORMAL HIGH (ref 0–99)
NonHDL: 125.72
Total CHOL/HDL Ratio: 4
Triglycerides: 108 mg/dL (ref 0.0–149.0)
VLDL: 21.6 mg/dL (ref 0.0–40.0)

## 2018-04-06 LAB — PSA: PSA: 0.41 ng/mL (ref 0.10–4.00)

## 2018-04-06 LAB — TSH: TSH: 1.67 u[IU]/mL (ref 0.35–4.50)

## 2018-04-06 NOTE — Assessment & Plan Note (Signed)
Anxiety depression: Since the last visit in 2017 he is doing well, good compliance with Lexapro.  he is now working at [employer] and very happy about it.  His 2 children live with him, they are going to pharmacy school.  We talked about possibly adjust down his medication but at this point he feels well.  Will  reassess that issue yearly. RTC 1 year

## 2018-06-07 DIAGNOSIS — H33021 Retinal detachment with multiple breaks, right eye: Secondary | ICD-10-CM | POA: Diagnosis not present

## 2018-06-07 DIAGNOSIS — H2512 Age-related nuclear cataract, left eye: Secondary | ICD-10-CM | POA: Diagnosis not present

## 2018-06-07 DIAGNOSIS — H2511 Age-related nuclear cataract, right eye: Secondary | ICD-10-CM | POA: Diagnosis not present

## 2018-06-07 DIAGNOSIS — H3321 Serous retinal detachment, right eye: Secondary | ICD-10-CM | POA: Diagnosis not present

## 2018-06-09 DIAGNOSIS — H33021 Retinal detachment with multiple breaks, right eye: Secondary | ICD-10-CM | POA: Diagnosis not present

## 2018-06-14 DIAGNOSIS — H33021 Retinal detachment with multiple breaks, right eye: Secondary | ICD-10-CM | POA: Diagnosis not present

## 2018-06-21 DIAGNOSIS — Z09 Encounter for follow-up examination after completed treatment for conditions other than malignant neoplasm: Secondary | ICD-10-CM | POA: Diagnosis not present

## 2018-06-21 DIAGNOSIS — H33021 Retinal detachment with multiple breaks, right eye: Secondary | ICD-10-CM | POA: Diagnosis not present

## 2018-07-12 DIAGNOSIS — H33021 Retinal detachment with multiple breaks, right eye: Secondary | ICD-10-CM | POA: Diagnosis not present

## 2018-07-17 DIAGNOSIS — H3341 Traction detachment of retina, right eye: Secondary | ICD-10-CM | POA: Diagnosis not present

## 2018-07-21 DIAGNOSIS — H3341 Traction detachment of retina, right eye: Secondary | ICD-10-CM | POA: Diagnosis not present

## 2018-07-21 DIAGNOSIS — H33053 Total retinal detachment, bilateral: Secondary | ICD-10-CM | POA: Diagnosis not present

## 2018-07-29 DIAGNOSIS — H3341 Traction detachment of retina, right eye: Secondary | ICD-10-CM | POA: Diagnosis not present

## 2018-08-04 DIAGNOSIS — H3341 Traction detachment of retina, right eye: Secondary | ICD-10-CM | POA: Diagnosis not present

## 2018-08-04 DIAGNOSIS — H2511 Age-related nuclear cataract, right eye: Secondary | ICD-10-CM | POA: Diagnosis not present

## 2018-08-04 DIAGNOSIS — H35371 Puckering of macula, right eye: Secondary | ICD-10-CM | POA: Diagnosis not present

## 2018-08-12 DIAGNOSIS — H3341 Traction detachment of retina, right eye: Secondary | ICD-10-CM | POA: Diagnosis not present

## 2018-08-12 DIAGNOSIS — H35371 Puckering of macula, right eye: Secondary | ICD-10-CM | POA: Diagnosis not present

## 2018-09-06 DIAGNOSIS — H35371 Puckering of macula, right eye: Secondary | ICD-10-CM | POA: Diagnosis not present

## 2018-10-18 DIAGNOSIS — H35371 Puckering of macula, right eye: Secondary | ICD-10-CM | POA: Diagnosis not present

## 2018-10-18 DIAGNOSIS — H3341 Traction detachment of retina, right eye: Secondary | ICD-10-CM | POA: Diagnosis not present

## 2018-11-01 ENCOUNTER — Ambulatory Visit (INDEPENDENT_AMBULATORY_CARE_PROVIDER_SITE_OTHER): Payer: 59 | Admitting: Internal Medicine

## 2018-11-01 ENCOUNTER — Encounter: Payer: Self-pay | Admitting: Internal Medicine

## 2018-11-01 VITALS — BP 126/72 | HR 88 | Temp 97.5°F | Resp 16 | Ht 73.0 in | Wt 230.5 lb

## 2018-11-01 DIAGNOSIS — N529 Male erectile dysfunction, unspecified: Secondary | ICD-10-CM | POA: Diagnosis not present

## 2018-11-01 MED ORDER — SILDENAFIL CITRATE 20 MG PO TABS: 60.0000 mg | ORAL_TABLET | Freq: Every evening | ORAL | 3 refills | Status: DC | PRN

## 2018-11-01 NOTE — Patient Instructions (Signed)
Take sildenafil as prescribed  Talk wit your eye doctor before you take it

## 2018-11-01 NOTE — Progress Notes (Signed)
Pre visit review using our clinic review tool, if applicable. No additional management support is needed unless otherwise documented below in the visit note. 

## 2018-11-01 NOTE — Progress Notes (Signed)
Subjective:    Patient ID: Ian Vasquez, male    DOB: [birth year], 52 y.o.   MRN: [id number]  DOS:  11/01/2018 Type of visit - description : To discuss ED  has noted some difficulty maintaining an erection x last 2 months . Denies any depression, anxiety, well controlled with Lexapro.   Review of Systems Denies chest pain or difficulty breathing.  No palpitations. No claudication. No fatigue.   Past Medical History:  Diagnosis Date  . Anxiety and depression   . Diverticulitis   . Iliopsoas bursitis     Past Surgical History:  Procedure Laterality Date  . NO PAST SURGERIES      Social History   Socioeconomic History  . Marital status: Divorced    Spouse name: Not on file  . Number of children: 2  . Years of education: Not on file  . Highest education level: Not on file  Occupational History  . Occupation: works a Naval architect, [employer]  . Financial resource strain: Not on file  . Food insecurity:    Worry: Not on file    Inability: Not on file  . Transportation needs:    Medical: Not on file    Non-medical: Not on file  Tobacco Use  . Smoking status: Never Smoker  . Smokeless tobacco: Never Used  Substance and Sexual Activity  . Alcohol use: Yes    Comment: one a week   . Drug use: No  . Sexual activity: Not on file  Lifestyle  . Physical activity:    Days per week: Not on file    Minutes per session: Not on file  . Stress: Not on file  Relationships  . Social connections:    Talks on phone: Not on file    Gets together: Not on file    Attends religious service: Not on file    Active member of club or organization: Not on file    Attends meetings of clubs or organizations: Not on file    Relationship status: Not on file  . Intimate partner violence:    Fear of current or ex partner: Not on file    Emotionally abused: Not on file    Physically abused: Not on file    Forced sexual activity: Not on file  Other Topics Concern  . Not on  file  Social History Narrative   Seperated from wife since ~ 03/2016   Twins, 17 y/o      Allergies as of 11/01/2018   No Known Allergies     Medication List        Accurate as of 11/01/18 11:59 PM. Always use your most recent med list.          escitalopram 20 MG tablet Commonly known as:  LEXAPRO Take 1 tablet (20 mg total) by mouth daily.   sildenafil 20 MG tablet Commonly known as:  REVATIO Take 3-4 tablets (60-80 mg total) by mouth at bedtime as needed.           Objective:   Physical Exam BP 126/72 (BP Location: Left Arm, Patient Position: Sitting, Cuff Size: Normal)   Pulse 88   Temp (!) 97.5 F (36.4 C) (Oral)   Resp 16   Ht 6\' 1"  (1.854 m)   Wt 230 lb 8 oz (104.6 kg)   SpO2 98%   BMI 30.41 kg/m  General:   Well developed, NAD, BMI noted. HEENT:  Normocephalic . Face symmetric, atraumatic Skin:  Not pale. Not jaundice Neurologic:  alert & oriented X3.  Speech normal, gait appropriate for age and unassisted Psych--  Cognition and judgment appear intact.  Cooperative with normal attention span and concentration.  Behavior appropriate. No anxious or depressed appearing.      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 He also have recently a retinal detachment, strongly advised to discuss with ophthalmology the use of sildenafil before he starts. He verbalized understanding. Preventive care: Declined a flu shot

## 2018-11-02 DIAGNOSIS — N529 Male erectile dysfunction, unspecified: Secondary | ICD-10-CM | POA: Insufficient documentation

## 2018-11-02 NOTE — Assessment & Plan Note (Signed)
ED: Newly noted, no known cardiovascular disease, patient request a medication, I agreed to prescribe sildenafil, how to take it, precautions and side effects discussed. He also have recently a retinal detachment, strongly advised to discuss with ophthalmology the use of sildenafil before he starts. He verbalized understanding. Preventive care: Declined a flu shot

## 2018-12-13 DIAGNOSIS — H33021 Retinal detachment with multiple breaks, right eye: Secondary | ICD-10-CM | POA: Diagnosis not present

## 2018-12-13 DIAGNOSIS — H3341 Traction detachment of retina, right eye: Secondary | ICD-10-CM | POA: Diagnosis not present

## 2018-12-13 DIAGNOSIS — H35371 Puckering of macula, right eye: Secondary | ICD-10-CM | POA: Diagnosis not present

## 2018-12-13 DIAGNOSIS — H2701 Aphakia, right eye: Secondary | ICD-10-CM | POA: Diagnosis not present

## 2019-04-21 DIAGNOSIS — H33053 Total retinal detachment, bilateral: Secondary | ICD-10-CM | POA: Diagnosis not present

## 2019-04-21 DIAGNOSIS — H3341 Traction detachment of retina, right eye: Secondary | ICD-10-CM | POA: Diagnosis not present

## 2019-04-21 DIAGNOSIS — H35371 Puckering of macula, right eye: Secondary | ICD-10-CM | POA: Diagnosis not present

## 2019-04-21 DIAGNOSIS — H2701 Aphakia, right eye: Secondary | ICD-10-CM | POA: Diagnosis not present

## 2019-07-05 ENCOUNTER — Encounter: Payer: Self-pay | Admitting: Internal Medicine

## 2019-11-29 ENCOUNTER — Telehealth: Payer: Self-pay | Admitting: Internal Medicine

## 2019-11-29 NOTE — Telephone Encounter (Signed)
Okay with me 

## 2019-11-29 NOTE — Telephone Encounter (Signed)
Admin team please help schedule TOC

## 2019-11-29 NOTE — Telephone Encounter (Signed)
Okay to transfer  

## 2019-11-29 NOTE — Telephone Encounter (Signed)
Pt request to transfer from Dr. Larose Kells to anyone taking new pt/TOC at [facility]. Dr. Ethelene Hal  please advise.   Pt stated he so close to this location that's why he wants to tranfers.      Copied from [place] [id number]. Topic: General - Inquiry >> Nov 25, 2019  3:50 PM Alease Frame wrote: Reason for CRM: Patient is wanting to do a transfer of care to [facility] . Please advise   Call back [phone]

## 2019-12-01 NOTE — Telephone Encounter (Signed)
Lvm 12/01/2019 AD to try and schedule

## 2020-01-19 ENCOUNTER — Telehealth: Payer: Self-pay

## 2020-01-19 NOTE — Telephone Encounter (Signed)
Patient called in to see if Dr. Larose Kells can send in a refill for   sildenafil (REVATIO) 20 MG tablet   Please follow up with the patient at [phone]

## 2020-01-19 NOTE — Telephone Encounter (Signed)
Spoke w/ Pt- informed unable to refill until visit on 02/06/2020- last ov 2019- Pt verbalized understanding.

## 2020-01-26 ENCOUNTER — Telehealth (INDEPENDENT_AMBULATORY_CARE_PROVIDER_SITE_OTHER): Payer: BC Managed Care – PPO | Admitting: Family Medicine

## 2020-01-26 ENCOUNTER — Encounter: Payer: Self-pay | Admitting: Family Medicine

## 2020-01-26 VITALS — Temp 98.2°F | Ht 73.0 in | Wt 218.0 lb

## 2020-01-26 DIAGNOSIS — Z0000 Encounter for general adult medical examination without abnormal findings: Secondary | ICD-10-CM

## 2020-01-26 DIAGNOSIS — N529 Male erectile dysfunction, unspecified: Secondary | ICD-10-CM

## 2020-01-26 DIAGNOSIS — F324 Major depressive disorder, single episode, in partial remission: Secondary | ICD-10-CM | POA: Insufficient documentation

## 2020-01-26 NOTE — Patient Instructions (Signed)
Health Maintenance, Male Adopting a healthy lifestyle and getting preventive care are important in promoting health and wellness. Ask your health care provider about:  The right schedule for you to have regular tests and exams.  Things you can do on your own to prevent diseases and keep yourself healthy. What should I know about diet, weight, and exercise? Eat a healthy diet   Eat a diet that includes plenty of vegetables, fruits, low-fat dairy products, and lean protein.  Do not eat a lot of foods that are high in solid fats, added sugars, or sodium. Maintain a healthy weight Body mass index (BMI) is a measurement that can be used to identify possible weight problems. It estimates body fat based on height and weight. Your health care provider can help determine your BMI and help you achieve or maintain a healthy weight. Get regular exercise Get regular exercise. This is one of the most important things you can do for your health. Most adults should:  Exercise for at least 150 minutes each week. The exercise should increase your heart rate and make you sweat (moderate-intensity exercise).  Do strengthening exercises at least twice a week. This is in addition to the moderate-intensity exercise.  Spend less time sitting. Even light physical activity can be beneficial. Watch cholesterol and blood lipids Have your blood tested for lipids and cholesterol at 55 years of age, then have this test every 5 years. You may need to have your cholesterol levels checked more often if:  Your lipid or cholesterol levels are high.  You are older than 55 years of age.  You are at high risk for heart disease. What should I know about cancer screening? Many types of cancers can be detected early and may often be prevented. Depending on your health history and family history, you may need to have cancer screening at various ages. This may include screening for:  Colorectal cancer.  Prostate  cancer.  Skin cancer.  Lung cancer. What should I know about heart disease, diabetes, and high blood pressure? Blood pressure and heart disease  High blood pressure causes heart disease and increases the risk of stroke. This is more likely to develop in people who have high blood pressure readings, are of African descent, or are overweight.  Talk with your health care provider about your target blood pressure readings.  Have your blood pressure checked: ? Every 3-5 years if you are 18-39 years of age. ? Every year if you are 40 years old or older.  If you are between the ages of 65 and 75 and are a current or former smoker, ask your health care provider if you should have a one-time screening for abdominal aortic aneurysm (AAA). Diabetes Have regular diabetes screenings. This checks your fasting blood sugar level. Have the screening done:  Once every three years after age 45 if you are at a normal weight and have a low risk for diabetes.  More often and at a younger age if you are overweight or have a high risk for diabetes. What should I know about preventing infection? Hepatitis B If you have a higher risk for hepatitis B, you should be screened for this virus. Talk with your health care provider to find out if you are at risk for hepatitis B infection. Hepatitis C Blood testing is recommended for:  Everyone born from 1945 through 1965.  Anyone with known risk factors for hepatitis C. Sexually transmitted infections (STIs)  You should be screened each year   for STIs, including gonorrhea and chlamydia, if: ? You are sexually active and are younger than 55 years of age. ? You are older than 55 years of age and your health care provider tells you that you are at risk for this type of infection. ? Your sexual activity has changed since you were last screened, and you are at increased risk for chlamydia or gonorrhea. Ask your health care provider if you are at risk.  Ask your  health care provider about whether you are at high risk for HIV. Your health care provider may recommend a prescription medicine to help prevent HIV infection. If you choose to take medicine to prevent HIV, you should first get tested for HIV. You should then be tested every 3 months for as long as you are taking the medicine. Follow these instructions at home: Lifestyle  Do not use any products that contain nicotine or tobacco, such as cigarettes, e-cigarettes, and chewing tobacco. If you need help quitting, ask your health care provider.  Do not use street drugs.  Do not share needles.  Ask your health care provider for help if you need support or information about quitting drugs. Alcohol use  Do not drink alcohol if your health care provider tells you not to drink.  If you drink alcohol: ? Limit how much you have to 0-2 drinks a day. ? Be aware of how much alcohol is in your drink. In the [place] one drink equals one 12 oz bottle of beer (355 mL), one 5 oz glass of wine (148 mL), or one 1 oz glass of hard liquor (44 mL). General instructions  Schedule regular health, dental, and eye exams.  Stay current with your vaccines.  Tell your health care provider if: ? You often feel depressed. ? You have ever been abused or do not feel safe at home. Summary  Adopting a healthy lifestyle and getting preventive care are important in promoting health and wellness.  Follow your health care provider's instructions about healthy diet, exercising, and getting tested or screened for diseases.  Follow your health care provider's instructions on monitoring your cholesterol and blood pressure. This information is not intended to replace advice given to you by your health care provider. Make sure you discuss any questions you have with your health care provider. Document Revised: 11/17/2018 Document Reviewed: 11/17/2018 Elsevier Patient Education  2020 Elsevier Inc.  Preventive Care 40-64 Years  Old, Male Preventive care refers to lifestyle choices and visits with your health care provider that can promote health and wellness. This includes:  A yearly physical exam. This is also called an annual well check.  Regular dental and eye exams.  Immunizations.  Screening for certain conditions.  Healthy lifestyle choices, such as eating a healthy diet, getting regular exercise, not using drugs or products that contain nicotine and tobacco, and limiting alcohol use. What can I expect for my preventive care visit? Physical exam Your health care provider will check:  Height and weight. These may be used to calculate body mass index (BMI), which is a measurement that tells if you are at a healthy weight.  Heart rate and blood pressure.  Your skin for abnormal spots. Counseling Your health care provider may ask you questions about:  Alcohol, tobacco, and drug use.  Emotional well-being.  Home and relationship well-being.  Sexual activity.  Eating habits.  Work and work environment. What immunizations do I need?  Influenza (flu) vaccine  This is recommended every year. Tetanus, diphtheria,   and pertussis (Tdap) vaccine  You may need a Td booster every 10 years. Varicella (chickenpox) vaccine  You may need this vaccine if you have not already been vaccinated. Zoster (shingles) vaccine  You may need this after age 63. Measles, mumps, and rubella (MMR) vaccine  You may need at least one dose of MMR if you were born in 1957 or later. You may also need a second dose. Pneumococcal conjugate (PCV13) vaccine  You may need this if you have certain conditions and were not previously vaccinated. Pneumococcal polysaccharide (PPSV23) vaccine  You may need one or two doses if you smoke cigarettes or if you have certain conditions. Meningococcal conjugate (MenACWY) vaccine  You may need this if you have certain conditions. Hepatitis A vaccine  You may need this if you have  certain conditions or if you travel or work in places where you may be exposed to hepatitis A. Hepatitis B vaccine  You may need this if you have certain conditions or if you travel or work in places where you may be exposed to hepatitis B. Haemophilus influenzae type b (Hib) vaccine  You may need this if you have certain risk factors. Human papillomavirus (HPV) vaccine  If recommended by your health care provider, you may need three doses over 6 months. You may receive vaccines as individual doses or as more than one vaccine together in one shot (combination vaccines). Talk with your health care provider about the risks and benefits of combination vaccines. What tests do I need? Blood tests  Lipid and cholesterol levels. These may be checked every 5 years, or more frequently if you are over 68 years old.  Hepatitis C test.  Hepatitis B test. Screening  Lung cancer screening. You may have this screening every year starting at age 78 if you have a 30-pack-year history of smoking and currently smoke or have quit within the past 15 years.  Prostate cancer screening. Recommendations will vary depending on your family history and other risks.  Colorectal cancer screening. All adults should have this screening starting at age 38 and continuing until age 22. Your health care provider may recommend screening at age 73 if you are at increased risk. You will have tests every 1-10 years, depending on your results and the type of screening test.  Diabetes screening. This is done by checking your blood sugar (glucose) after you have not eaten for a while (fasting). You may have this done every 1-3 years.  Sexually transmitted disease (STD) testing. Follow these instructions at home: Eating and drinking  Eat a diet that includes fresh fruits and vegetables, whole grains, lean protein, and low-fat dairy products.  Take vitamin and mineral supplements as recommended by your health care  provider.  Do not drink alcohol if your health care provider tells you not to drink.  If you drink alcohol: ? Limit how much you have to 0-2 drinks a day. ? Be aware of how much alcohol is in your drink. In the [place] one drink equals one 12 oz bottle of beer (355 mL), one 5 oz glass of wine (148 mL), or one 1 oz glass of hard liquor (44 mL). Lifestyle  Take daily care of your teeth and gums.  Stay active. Exercise for at least 30 minutes on 5 or more days each week.  Do not use any products that contain nicotine or tobacco, such as cigarettes, e-cigarettes, and chewing tobacco. If you need help quitting, ask your health care provider.  If  you are sexually active, practice safe sex. Use a condom or other form of protection to prevent STIs (sexually transmitted infections).  Talk with your health care provider about taking a low-dose aspirin every day starting at age 50. What's next?  Go to your health care provider once a year for a well check visit.  Ask your health care provider how often you should have your eyes and teeth checked.  Stay up to date on all vaccines. This information is not intended to replace advice given to you by your health care provider. Make sure you discuss any questions you have with your health care provider. Document Revised: 11/18/2018 Document Reviewed: 11/18/2018 Elsevier Patient Education  2020 Elsevier Inc.  
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
## 2020-01-27 ENCOUNTER — Telehealth: Payer: Self-pay | Admitting: Family Medicine

## 2020-01-27 DIAGNOSIS — F32A Depression, unspecified: Secondary | ICD-10-CM

## 2020-01-27 DIAGNOSIS — F419 Anxiety disorder, unspecified: Secondary | ICD-10-CM

## 2020-01-27 DIAGNOSIS — F329 Major depressive disorder, single episode, unspecified: Secondary | ICD-10-CM

## 2020-01-27 DIAGNOSIS — N529 Male erectile dysfunction, unspecified: Secondary | ICD-10-CM

## 2020-01-27 MED ORDER — SILDENAFIL CITRATE 20 MG PO TABS: ORAL_TABLET | ORAL | 3 refills | Status: DC

## 2020-01-27 MED ORDER — ESCITALOPRAM OXALATE 20 MG PO TABS: 20.0000 mg | ORAL_TABLET | Freq: Every day | ORAL | 3 refills | Status: DC

## 2020-01-27 NOTE — Telephone Encounter (Signed)
Patient is calling and requesting a refill for estcitalpram and sildenafil sent to [facility]. CB is [phone]

## 2020-01-27 NOTE — Telephone Encounter (Signed)
Last VV 01/26/2020 Last fill for Sildenafil 11/01/18  #30/3 Last fill for Escitalopram 04/05/18  #90/3

## 2020-01-27 NOTE — Telephone Encounter (Signed)
Done

## 2020-02-06 ENCOUNTER — Encounter: Payer: 59 | Admitting: Internal Medicine

## 2020-02-28 DIAGNOSIS — F900 Attention-deficit hyperactivity disorder, predominantly inattentive type: Secondary | ICD-10-CM | POA: Diagnosis not present

## 2020-02-28 DIAGNOSIS — F329 Major depressive disorder, single episode, unspecified: Secondary | ICD-10-CM | POA: Diagnosis not present

## 2020-02-28 DIAGNOSIS — F419 Anxiety disorder, unspecified: Secondary | ICD-10-CM | POA: Diagnosis not present

## 2020-08-16 DIAGNOSIS — R7989 Other specified abnormal findings of blood chemistry: Secondary | ICD-10-CM | POA: Diagnosis not present

## 2020-08-16 DIAGNOSIS — G43009 Migraine without aura, not intractable, without status migrainosus: Secondary | ICD-10-CM | POA: Diagnosis not present

## 2020-09-13 DIAGNOSIS — G43009 Migraine without aura, not intractable, without status migrainosus: Secondary | ICD-10-CM | POA: Diagnosis not present

## 2021-03-14 DIAGNOSIS — Z13220 Encounter for screening for lipoid disorders: Secondary | ICD-10-CM | POA: Diagnosis not present

## 2021-03-14 DIAGNOSIS — R5383 Other fatigue: Secondary | ICD-10-CM | POA: Diagnosis not present

## 2021-03-14 DIAGNOSIS — Z0000 Encounter for general adult medical examination without abnormal findings: Secondary | ICD-10-CM | POA: Diagnosis not present

## 2021-04-10 ENCOUNTER — Encounter: Payer: Self-pay | Admitting: Gastroenterology

## 2022-10-21 ENCOUNTER — Encounter: Payer: Self-pay | Admitting: Family Medicine

## 2022-10-21 ENCOUNTER — Ambulatory Visit: Payer: BC Managed Care – PPO | Admitting: Family Medicine

## 2022-10-21 VITALS — BP 124/76 | HR 79 | Temp 97.6°F | Ht 73.0 in | Wt 234.2 lb

## 2022-10-21 DIAGNOSIS — R5383 Other fatigue: Secondary | ICD-10-CM | POA: Diagnosis not present

## 2022-10-21 DIAGNOSIS — N5201 Erectile dysfunction due to arterial insufficiency: Secondary | ICD-10-CM | POA: Diagnosis not present

## 2022-10-21 DIAGNOSIS — H3321 Serous retinal detachment, right eye: Secondary | ICD-10-CM | POA: Diagnosis not present

## 2022-10-21 MED ORDER — TADALAFIL 20 MG PO TABS: 10.0000 mg | ORAL_TABLET | ORAL | 11 refills | Status: AC | PRN

## 2022-10-21 NOTE — Progress Notes (Signed)
Established Patient Office Visit  Subjective   Patient ID: Ian Vasquez, male    DOB: 1966/01/20  Age: 56 y.o. MRN: [id number]  Chief Complaint  Patient presents with   Fatigue    Low energy becoming worse symptoms x 1 year. Would like B12 and TSH checked     HPI presents reporting a 1 year history of ongoing fatigue.  Difficulty to get going but once he does he is okay.  Cannot work as long as he used to.  Seen virtually 2 years ago and was advised to return for physical but was lost to follow-up.  Recently started exercising again over the last month.  Has been exercising aerobically over the last week.  He lives alone with his dog.  A friend has suggested that he have his B12 rechecked.  TSH was 1.674 years ago.  Spontaneously detached retina of OD 3 years ago.  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    Review of Systems  Constitutional:  Positive for malaise/fatigue. Negative for weight loss.  HENT: Negative.    Eyes:  Negative for blurred vision, discharge and redness.  Respiratory: Negative.    Cardiovascular: Negative.   Gastrointestinal:  Negative for abdominal pain.  Genitourinary: Negative.   Musculoskeletal: Negative.  Negative for myalgias.  Skin:  Negative for rash.  Neurological:  Negative for tingling, loss of consciousness and weakness.  Endo/Heme/Allergies:  Negative for polydipsia.      Objective:     BP 124/76 (BP Location: Right Arm, Patient Position: Sitting, Cuff Size: Large)   Pulse 79   Temp 97.6 F (36.4 C) (Temporal)   Ht '6\' 1"'$  (1.854 m)   Wt 234 lb 3.2 oz (106.2 kg)   SpO2 98%   BMI 30.90 kg/m  BP Readings from Last 3 Encounters:  10/21/22 124/76  11/01/18 126/72  04/05/18 118/70   Wt Readings from Last 3 Encounters:  10/21/22 234 lb 3.2 oz (106.2 kg)  01/26/20 218 lb (98.9 kg)  11/01/18 230 lb 8 oz (104.6 kg)      Physical Exam Constitutional:      General: He is not in acute distress.    Appearance: Normal appearance. He is not ill-appearing,  toxic-appearing or diaphoretic.  HENT:     Head: Normocephalic and atraumatic.     Right Ear: External ear normal.     Left Ear: External ear normal.     Mouth/Throat:     Mouth: Mucous membranes are moist.     Pharynx: Oropharynx is clear. No oropharyngeal exudate or posterior oropharyngeal erythema.  Eyes:     General: No scleral icterus.       Right eye: No discharge.        Left eye: No discharge.     Extraocular Movements: Extraocular movements intact.     Conjunctiva/sclera: Conjunctivae normal.     Comments: Pupil and OD is deformed.  Cardiovascular:     Rate and Rhythm: Normal rate and regular rhythm.  Pulmonary:     Effort: Pulmonary effort is normal. No respiratory distress.     Breath sounds: Normal breath sounds.  Musculoskeletal:     Cervical back: No rigidity or tenderness.  Lymphadenopathy:     Cervical: No cervical adenopathy.  Skin:    General: Skin is warm and dry.  Neurological:     Mental Status: He is alert and oriented to person, place, and time.  Psychiatric:        Mood and Affect: Mood normal.  Behavior: Behavior normal.      No results found for any visits on 10/21/22.    The ASCVD Risk score (Arnett DK, et al., 2019) failed to calculate for the following reasons:   Cannot find a previous HDL lab   Cannot find a previous total cholesterol lab    Assessment & Plan:   Problem List Items Addressed This Visit       Other   Erectile dysfunction   Relevant Medications   tadalafil (CIALIS) 20 MG tablet   Detached retina, right   Other Visit Diagnoses     Other fatigue    -  Primary   Relevant Orders   Vitamin B12   TSH   Testosterone       Return Will return fasting for physical exam..    We will try Cialis for ED. information given on exercising to lose weight. Libby Maw, MD

## 2022-10-22 LAB — VITAMIN B12: Vitamin B-12: 888 pg/mL (ref 211–911)

## 2022-10-22 LAB — TESTOSTERONE: Testosterone: 525.86 ng/dL (ref 300.00–890.00)

## 2022-10-22 LAB — TSH: TSH: 1.58 u[IU]/mL (ref 0.35–5.50)

## 2022-10-27 ENCOUNTER — Ambulatory Visit: Payer: BC Managed Care – PPO | Admitting: Family Medicine

## 2023-01-15 ENCOUNTER — Encounter: Payer: Self-pay | Admitting: Nurse Practitioner

## 2023-01-15 ENCOUNTER — Ambulatory Visit: Payer: BC Managed Care – PPO | Admitting: Nurse Practitioner

## 2023-01-15 VITALS — BP 130/100 | HR 79 | Temp 98.4°F | Resp 16 | Ht 72.0 in | Wt 235.0 lb

## 2023-01-15 DIAGNOSIS — J208 Acute bronchitis due to other specified organisms: Secondary | ICD-10-CM

## 2023-01-15 LAB — POC COVID19 BINAXNOW: SARS Coronavirus 2 Ag: NEGATIVE

## 2023-01-15 LAB — POCT INFLUENZA A/B
Influenza A, POC: NEGATIVE
Influenza B, POC: NEGATIVE

## 2023-01-15 MED ORDER — AZITHROMYCIN 250 MG PO TABS: 250.0000 mg | ORAL_TABLET | Freq: Every day | ORAL | 0 refills | Status: AC

## 2023-01-15 NOTE — Patient Instructions (Signed)
Negative COVID and Flu test. Encourage adequate oral hydration. Use mucinex DM or Robitussin  or delsym for cough.  You can use plain "Tylenol" or "Advil" for fever, chills and achyness. Use cool mist humidifier at bedtime to help with nasal congestion and cough.

## 2023-01-15 NOTE — Progress Notes (Signed)
Established Patient Visit  Patient: Ian Vasquez   DOB: 29-Sep-1966   57 y.o. Male  MRN: [id number] Visit Date: 01/15/2023  Subjective:    Chief Complaint  Patient presents with   Acute Visit    Samuel [place]- green mucus, congestions, body aches, cough,and sore throat. Started last night.    Cough This is a new problem. The current episode started 1 to 4 weeks ago. The problem has been gradually worsening. The problem occurs constantly. The cough is Productive of purulent sputum. Associated symptoms include chest pain, chills, myalgias, nasal congestion and postnasal drip. Pertinent negatives include no ear congestion, ear pain, fever, headaches, heartburn, hemoptysis, rash, rhinorrhea, sore throat, shortness of breath, sweats, weight loss or wheezing. Nothing aggravates the symptoms. Treatments tried: excedrin and claritin. The treatment provided moderate relief. There is no history of asthma, bronchiectasis, bronchitis, COPD, emphysema, environmental allergies or pneumonia.  Treated for influenza 35monthago, had lingering productive cough-clear which got worse yesterday with chills, malaise, and green sputum  BP Readings from Last 3 Encounters:  01/15/23 (!) 130/100  10/21/22 124/76  11/01/18 126/72    Reviewed medical, surgical, and social history today  Medications: Outpatient Medications Prior to Visit  Medication Sig   tadalafil (CIALIS) 20 MG tablet Take 0.5-1 tablets (10-20 mg total) by mouth every other day as needed for erectile dysfunction.   No facility-administered medications prior to visit.   Reviewed past medical and social history.   ROS per HPI above      Objective:  BP (!) 130/100 (BP Location: Left Arm, Patient Position: Sitting, Cuff Size: Large)   Pulse 79   Temp 98.4 F (36.9 C) (Oral)   Resp 16   Ht 6' (1.829 m)   Wt 235 lb (106.6 kg)   SpO2 98%   BMI 31.87 kg/m      Physical Exam Vitals reviewed.  Cardiovascular:     Rate and  Rhythm: Normal rate and regular rhythm.     Pulses: Normal pulses.     Heart sounds: Normal heart sounds.  Pulmonary:     Effort: Pulmonary effort is normal.     Breath sounds: Normal breath sounds.  Neurological:     Mental Status: He is alert and oriented to person, place, and time.     Results for orders placed or performed in visit on 01/15/23  POC COVID-19  Result Value Ref Range   SARS Coronavirus 2 Ag Negative Negative  POCT Influenza A/B  Result Value Ref Range   Influenza A, POC Negative Negative   Influenza B, POC Negative Negative      Assessment & Plan:    Problem List Items Addressed This Visit   None Visit Diagnoses     Acute bronchitis due to other specified organisms    -  Primary   Relevant Medications   azithromycin (ZITHROMAX Z-PAK) 250 MG tablet   Other Relevant Orders   POC COVID-19 (Completed)   POCT Influenza A/B (Completed)     Encourage adequate oral hydration. Avoid decongestants due to mild elevated BP. Advised to monitor BP at home. F/up with pcp if BP remains >130/80 Use mucinex DM or Robitussin  or delsym for cough.  You can use plain "Tylenol" or "Advil" for fever, chills and achyness. Use cool mist humidifier at bedtime to help with nasal congestion and cough.  Return if symptoms worsen or fail to improve.  Wilfred Lacy, NP

## 2023-09-11 ENCOUNTER — Other Ambulatory Visit: Payer: Self-pay | Admitting: Family Medicine

## 2023-09-11 DIAGNOSIS — Z1212 Encounter for screening for malignant neoplasm of rectum: Secondary | ICD-10-CM

## 2023-09-11 DIAGNOSIS — Z1211 Encounter for screening for malignant neoplasm of colon: Secondary | ICD-10-CM

## 2023-09-13 ENCOUNTER — Other Ambulatory Visit: Payer: Self-pay

## 2023-09-13 ENCOUNTER — Encounter (HOSPITAL_COMMUNITY): Payer: Self-pay

## 2023-09-13 ENCOUNTER — Emergency Department (HOSPITAL_COMMUNITY)
Admission: EM | Admit: 2023-09-13 | Discharge: 2023-09-13 | Disposition: A | Discharge status: Home / Self Care | Payer: Worker's Compensation | Attending: Emergency Medicine | Admitting: Emergency Medicine

## 2023-09-13 DIAGNOSIS — Y990 Civilian activity done for income or pay: Secondary | ICD-10-CM | POA: Insufficient documentation

## 2023-09-13 DIAGNOSIS — S81811A Laceration without foreign body, right lower leg, initial encounter: Secondary | ICD-10-CM | POA: Insufficient documentation

## 2023-09-13 DIAGNOSIS — W260XXA Contact with knife, initial encounter: Secondary | ICD-10-CM | POA: Diagnosis not present

## 2023-09-13 DIAGNOSIS — Z23 Encounter for immunization: Secondary | ICD-10-CM | POA: Diagnosis not present

## 2023-09-13 DIAGNOSIS — S8991XA Unspecified injury of right lower leg, initial encounter: Secondary | ICD-10-CM | POA: Diagnosis present

## 2023-09-13 MED ORDER — TETANUS-DIPHTH-ACELL PERTUSSIS 5-2.5-18.5 LF-MCG/0.5 IM SUSY
0.5000 mL | PREFILLED_SYRINGE | Freq: Once | INTRAMUSCULAR | Status: AC
  Administered 2023-09-13: 0.5 mL via INTRAMUSCULAR
  Filled 2023-09-13: qty 0.5

## 2023-09-13 MED ORDER — CEPHALEXIN 500 MG PO CAPS: 500.0000 mg | ORAL_CAPSULE | Freq: Four times a day (QID) | ORAL | 0 refills | Status: AC

## 2023-09-13 NOTE — ED Triage Notes (Signed)
Patient reports a exacto knife fell off a shelf and sliced his right shin. Bleeding not controlled at time of triage. Combat gauze placed and pressure dressing on. Unknown when last tetanus shot was.

## 2023-09-13 NOTE — Discharge Instructions (Signed)
Clean the area twice a day with soap and water gently.  Get staples out in 14 to 16 days.  Return if any problems

## 2023-09-13 NOTE — ED Provider Notes (Signed)
  [place] EMERGENCY DEPARTMENT AT [facility] Provider Note   CSN: [id number] Arrival date & time: 09/13/23  9147     History {Add pertinent medical, surgical, social history, OB history to HPI:1} Chief Complaint  Patient presents with   Laceration    Ian Vasquez is a 57 y.o. male.  Patient was at work today and a knife fell off of a ledge and hit his right lower leg.  Patient has no medical problems   Laceration      Home Medications Prior to Admission medications   Medication Sig Start Date End Date Taking? Authorizing Provider  cephALEXin (KEFLEX) 500 MG capsule Take 1 capsule (500 mg total) by mouth 4 (four) times daily. 09/13/23  Yes Bethann Berkshire, MD  azithromycin (ZITHROMAX Z-PAK) 250 MG tablet Take 1 tablet (250 mg total) by mouth daily. Take 2tabs on first day, then 1tab once a day till complete 01/15/23   Nche, Bonna Gains, NP  tadalafil (CIALIS) 20 MG tablet Take 0.5-1 tablets (10-20 mg total) by mouth every other day as needed for erectile dysfunction. 10/21/22   Mliss Sax, MD      Allergies    Sildenafil    Review of Systems   Review of Systems  Physical Exam Updated Vital Signs BP (!) 155/98 Comment: nurse notified  Pulse 98   Temp 98.2 F (36.8 C) (Oral)   Resp 14   Ht 6' (1.829 m)   Wt 102.1 kg   SpO2 99%   BMI 30.52 kg/m  Physical Exam  ED Results / Procedures / Treatments   Labs (all labs ordered are listed, but only abnormal results are displayed) Labs Reviewed - No data to display  EKG None  Radiology No results found.  Procedures Procedures  {Document cardiac monitor, telemetry assessment procedure when appropriate:1}  Medications Ordered in ED Medications  Tdap (BOOSTRIX) injection 0.5 mL (0.5 mLs Intramuscular Given 09/13/23 0751)    ED Course/ Medical Decision Making/ A&P   {Patient had a 5 cm laceration that was superficial to the right lower leg.  Area was cleaned thoroughly with Betadine  and 9 staples were used to close laceration Click here for ABCD2, HEART and other calculatorsREFRESH Note before signing :1}                              Medical Decision Making Risk Prescription drug management.   Patient with superficial lacerations to right lower leg with 9 staples used to close it.  Patient given Keflex for 5 days and will have staples removed in 14 to 16 days  {Document critical care time when appropriate:1} {Document review of labs and clinical decision tools ie heart score, Chads2Vasc2 etc:1}  {Document your independent review of radiology images, and any outside records:1} {Document your discussion with family members, caretakers, and with consultants:1} {Document social determinants of health affecting pt's care:1} {Document your decision making why or why not admission, treatments were needed:1} Final Clinical Impression(s) / ED Diagnoses Final diagnoses:  Laceration of right lower extremity, initial encounter    Rx / DC Orders ED Discharge Orders          Ordered    cephALEXin (KEFLEX) 500 MG capsule  4 times daily        09/13/23 0817

## 2023-09-14 ENCOUNTER — Ambulatory Visit: Payer: Self-pay | Admitting: *Deleted

## 2023-09-14 ENCOUNTER — Telehealth: Payer: Self-pay | Admitting: Family Medicine

## 2023-09-14 NOTE — Telephone Encounter (Signed)
Reason for Disposition  [1] Caller has URGENT medicine question about med that PCP or specialist prescribed AND [2] triager unable to answer question    Keflex 500 mg tablets prescribed to be taken 4 times a day.   Only 10 tablets were given by ED provider Dr. Bethann Berkshire at [facility] ED.  Pt called in because he only was prescribed 10 tablets.  Answer Assessment - Initial Assessment Questions 1. NAME of MEDICINE: "What medicine(s) are you calling about?"     I was seen in ED.   I have a question about my medication.  2. QUESTION: "What is your question?" (e.g., double dose of medicine, side effect)     I called my PCP but they told me to call the ED back.   My PCP is with [facility].   3. PRESCRIBER: "Who prescribed the medicine?" Reason: if prescribed by specialist, call should be referred to that group.     ED dr. 4. SYMPTOMS: "Do you have any symptoms?" If Yes, ask: "What symptoms are you having?"  "How bad are the symptoms (e.g., mild, moderate, severe)     N/A 5. PREGNANCY:  "Is there any chance that you are pregnant?" "When was your last menstrual period?"     N/A  Protocols used: Medication Question Call-A-AH

## 2023-09-14 NOTE — Telephone Encounter (Addendum)
  Chief Complaint: Seen in [facility] ED on 09/13/2023.   Keflex capsules 500 mg ordered to be taken 4 times a day.   He was given 10 capsules.   How long should he be taking this? Symptoms: Had a laceration stitched. Frequency: yesterday 09/13/2023 Pertinent Negatives: Patient denies getting enough of the medication.   He got 10 capsules only.   Disposition: [] ED /[] Urgent Care (no appt availability in office) / [] Appointment(In office/virtual)/ []  [facility]/ [] Home Care/ [] Refused Recommended Disposition /[] [facility]/ [x]  Follow-up with PCP Additional Notes: I have sent a secure chat to Dr. Bethann Berkshire.      Dr. Estell Harpin replied via secure chat a few minutes later that he had spoke with the pt.

## 2023-09-14 NOTE — Telephone Encounter (Signed)
I will call him.

## 2023-09-14 NOTE — Telephone Encounter (Signed)
Pt is asking if he was supposed to get his med for 10 days instead of 10 pills?   cephALEXin (KEFLEX) 500 MG capsule [[id number]]    Please call him with answer.  Raul [phone]  [id number] [place] [address], [place] - [address] AT [facility] [address] [place] [address] Phone: [phone]  Fax: [phone]

## 2023-09-14 NOTE — Telephone Encounter (Signed)
Pt will call [facility]

## 2023-09-22 ENCOUNTER — Telehealth: Payer: Self-pay | Admitting: Family Medicine

## 2023-09-22 NOTE — Telephone Encounter (Signed)
Pt has an upcoming appt with Dr Doreene Burke to have his staples removed on 09/29/23. He was told to leave them in for two weeks, he had them put in on 09/13/23. The staples are starting to aggravate him. He is wanting to know if he can have his appt moved up? Please advise pt at [phone]

## 2023-09-23 NOTE — Telephone Encounter (Signed)
Called patient, advised that we can seen him 09/24/23 @ 2:20 pm for provider to look and see if they ar okay to come out.  He is okay with being scheduled. Dm/cma

## 2023-09-24 ENCOUNTER — Encounter: Payer: Self-pay | Admitting: Family Medicine

## 2023-09-24 ENCOUNTER — Ambulatory Visit: Payer: BC Managed Care – PPO | Admitting: Family Medicine

## 2023-09-24 VITALS — BP 124/84 | HR 81 | Temp 97.7°F | Ht 72.0 in | Wt 232.2 lb

## 2023-09-24 DIAGNOSIS — Z4802 Encounter for removal of sutures: Secondary | ICD-10-CM

## 2023-09-24 NOTE — Progress Notes (Signed)
Established Patient Office Visit   Subjective:  Patient ID: AMRITPAL SHROPSHIRE, male    DOB: 06-11-1966  Age: 57 y.o. MRN: [id number]  Chief Complaint  Patient presents with   Suture / Staple Removal    Left lower leg staple removal. Staples were placed 11 days ago due to a laceration.     Suture / Staple Removal   Encounter Diagnoses  Name Primary?   Encounter for staple removal Yes   As above.  Staples placed in left anterior lower leg 11 days ago.  Wound is done well.  There is been no discharge or streaking.  No fevers chills   Review of Systems  Constitutional: Negative.   HENT: Negative.    Eyes:  Negative for blurred vision, discharge and redness.  Respiratory: Negative.    Cardiovascular: Negative.   Gastrointestinal:  Negative for abdominal pain.  Genitourinary: Negative.   Musculoskeletal: Negative.  Negative for myalgias.  Skin:  Negative for rash.  Neurological:  Negative for tingling, loss of consciousness and weakness.  Endo/Heme/Allergies:  Negative for polydipsia.     Current Outpatient Medications:    tadalafil (CIALIS) 20 MG tablet, Take 0.5-1 tablets (10-20 mg total) by mouth every other day as needed for erectile dysfunction., Disp: 10 tablet, Rfl: 11   azithromycin (ZITHROMAX Z-PAK) 250 MG tablet, Take 1 tablet (250 mg total) by mouth daily. Take 2tabs on first day, then 1tab once a day till complete (Patient not taking: Reported on 09/24/2023), Disp: 6 tablet, Rfl: 0   cephALEXin (KEFLEX) 500 MG capsule, Take 1 capsule (500 mg total) by mouth 4 (four) times daily. (Patient not taking: Reported on 09/24/2023), Disp: 10 capsule, Rfl: 0   Objective:     BP 124/84   Pulse 81   Temp 97.7 F (36.5 C)   Ht 6' (1.829 m)   Wt 232 lb 3.2 oz (105.3 kg)   SpO2 97%   BMI 31.49 kg/m    Physical Exam Constitutional:      General: He is not in acute distress.    Appearance: Normal appearance. He is not ill-appearing, toxic-appearing or diaphoretic.   HENT:     Head: Normocephalic and atraumatic.     Right Ear: External ear normal.     Left Ear: External ear normal.  Eyes:     General: No scleral icterus.       Right eye: No discharge.        Left eye: No discharge.     Extraocular Movements: Extraocular movements intact.     Conjunctiva/sclera: Conjunctivae normal.  Pulmonary:     Effort: Pulmonary effort is normal. No respiratory distress.  Skin:    General: Skin is warm and dry.       Neurological:     Mental Status: He is alert and oriented to person, place, and time.  Psychiatric:        Mood and Affect: Mood normal.        Behavior: Behavior normal.    Subjective:    SHAAN RHOADS is a 57 y.o. male who obtained a laceration  11  days ago, which required closure with 10 staples. Mechanism of injury: laceration. He denies pain, redness, or drainage from the wound. His last tetanus was  11  days ago.  The following portions of the patient's history were reviewed and updated as appropriate: He  has a past medical history of Anxiety and depression, Detached retina, right, Diverticulitis, and Iliopsoas bursitis..  Review  of Systems Pertinent items are noted in HPI.    Objective:    BP 124/84   Pulse 81   Temp 97.7 F (36.5 C)   Ht 6' (1.829 m)   Wt 232 lb 3.2 oz (105.3 kg)   SpO2 97%   BMI 31.49 kg/m  Injury exam:  A 8 cm laceration noted on the right anterior shin is healing well, without evidence of infection.    Assessment:    Laceration is healing well, without evidence of infection.    Plan:     1. 10 staple were removed. 2. Wound care discussed. 3. Follow up as needed.    No results found for any visits on 09/24/23.    The ASCVD Risk score (Arnett DK, et al., 2019) failed to calculate for the following reasons:   Cannot find a previous HDL lab   Cannot find a previous total cholesterol lab    Assessment & Plan:   Encounter for staple removal    Return for annual physical.     Mliss Sax, MD

## 2023-09-29 ENCOUNTER — Ambulatory Visit: Payer: BC Managed Care – PPO | Admitting: Family Medicine

## 2023-10-14 ENCOUNTER — Telehealth: Payer: Self-pay

## 2023-10-14 NOTE — Telephone Encounter (Signed)
Transition Care Management Unsuccessful Follow-up Telephone Call  Date of discharge and from where:  09/13/2023 [facility]  Attempts:  1st Attempt  Reason for unsuccessful TCM follow-up call:  No answer/busy  Chisum Habenicht [facility]  [facility], [facility] Resource Care Guide Direct Dial: [phone]  Website: [email or web address]

## 2023-10-14 NOTE — Telephone Encounter (Signed)
Transition Care Management Unsuccessful Follow-up Telephone Call  Date of discharge and from where:  09/13/2023 [facility]  Attempts:  2nd Attempt  Reason for unsuccessful TCM follow-up call:  Left voice message  Livian Vanderbeck [facility]  [facility], [facility] Resource Care Guide Direct Dial: [phone]  Website: [email or web address]

## 2023-10-26 ENCOUNTER — Ambulatory Visit (INDEPENDENT_AMBULATORY_CARE_PROVIDER_SITE_OTHER): Payer: 59 | Admitting: Family Medicine

## 2023-10-26 ENCOUNTER — Encounter: Payer: Self-pay | Admitting: Family Medicine

## 2023-10-26 VITALS — BP 138/86 | HR 80 | Temp 97.2°F | Ht 72.0 in | Wt 233.8 lb

## 2023-10-26 DIAGNOSIS — R351 Nocturia: Secondary | ICD-10-CM | POA: Diagnosis not present

## 2023-10-26 DIAGNOSIS — Z13220 Encounter for screening for lipoid disorders: Secondary | ICD-10-CM

## 2023-10-26 DIAGNOSIS — R030 Elevated blood-pressure reading, without diagnosis of hypertension: Secondary | ICD-10-CM

## 2023-10-26 DIAGNOSIS — Z1211 Encounter for screening for malignant neoplasm of colon: Secondary | ICD-10-CM

## 2023-10-26 DIAGNOSIS — Z131 Encounter for screening for diabetes mellitus: Secondary | ICD-10-CM

## 2023-10-26 DIAGNOSIS — Z1159 Encounter for screening for other viral diseases: Secondary | ICD-10-CM

## 2023-10-26 DIAGNOSIS — Z0000 Encounter for general adult medical examination without abnormal findings: Secondary | ICD-10-CM

## 2023-10-26 DIAGNOSIS — H3321 Serous retinal detachment, right eye: Secondary | ICD-10-CM

## 2023-10-26 DIAGNOSIS — K4090 Unilateral inguinal hernia, without obstruction or gangrene, not specified as recurrent: Secondary | ICD-10-CM | POA: Diagnosis not present

## 2023-10-26 NOTE — Progress Notes (Signed)
Established Patient Office Visit   Subjective:  Patient ID: Ian Vasquez, male    DOB: December 03, 1966  Age: 57 y.o. MRN: [id number]  Chief Complaint  Patient presents with   Annual Exam    CPE. Pt is fasting. No concerns.     HPI Encounter Diagnoses  Name Primary?   Healthcare maintenance Yes   Screening for colon cancer    Screening for cholesterol level    Screening for diabetes mellitus    Nocturia    Elevated BP without diagnosis of hypertension    Encounter for hepatitis C screening test for low risk patient    Unilateral inguinal hernia without obstruction or gangrene, recurrence not specified    Detached retina, right    For physical and follow-up of above.  Doing well.  He lives alone with his 53-year-old pitbull mix.  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  No history of hypertension.  Gets up at night a few times to urinate.  He is not restricting fluids before bedtime.  He has a Cologuard test kit at home that has not been used.  History of spontaneously detached retina of OD.   Review of Systems  Constitutional: Negative.   HENT: Negative.    Eyes:  Negative for blurred vision, discharge and redness.  Respiratory: Negative.    Cardiovascular: Negative.   Gastrointestinal:  Negative for abdominal pain.  Genitourinary: Negative.   Musculoskeletal: Negative.  Negative for myalgias.  Skin:  Negative for rash.  Neurological:  Negative for tingling, loss of consciousness and weakness.  Endo/Heme/Allergies:  Negative for polydipsia.      10/26/2023    3:18 PM 01/15/2023    1:59 PM 10/21/2022    3:57 PM  Depression screen PHQ 2/9  Decreased Interest 0 0 3  Down, Depressed, Hopeless 0 0 0  PHQ - 2 Score 0 0 3  Altered sleeping 0  1  Tired, decreased energy 1  1  Change in appetite 0  0  Feeling bad or failure about yourself  0  0  Trouble concentrating 0  0  Moving slowly or fidgety/restless 0  0  Suicidal thoughts 0  0  PHQ-9 Score 1  5  Difficult doing  work/chores Not difficult at all  Not difficult at all       Current Outpatient Medications:    azithromycin (ZITHROMAX Z-PAK) 250 MG tablet, Take 1 tablet (250 mg total) by mouth daily. Take 2tabs on first day, then 1tab once a day till complete (Patient not taking: Reported on 09/24/2023), Disp: 6 tablet, Rfl: 0   cephALEXin (KEFLEX) 500 MG capsule, Take 1 capsule (500 mg total) by mouth 4 (four) times daily. (Patient not taking: Reported on 09/24/2023), Disp: 10 capsule, Rfl: 0   tadalafil (CIALIS) 20 MG tablet, Take 0.5-1 tablets (10-20 mg total) by mouth every other day as needed for erectile dysfunction. (Patient not taking: Reported on 10/26/2023), Disp: 10 tablet, Rfl: 11   Objective:     BP 138/86   Pulse 80   Temp (!) 97.2 F (36.2 C)   Ht 6' (1.829 m)   Wt 233 lb 12.8 oz (106.1 kg)   SpO2 97%   BMI 31.71 kg/m  BP Readings from Last 3 Encounters:  10/26/23 138/86  09/24/23 124/84  09/13/23 (!) 155/98   Wt Readings from Last 3 Encounters:  10/26/23 233 lb 12.8 oz (106.1 kg)  09/24/23 232 lb 3.2 oz (105.3 kg)  09/13/23 225 lb (102.1 kg)  Physical Exam Constitutional:      General: He is not in acute distress.    Appearance: Normal appearance. He is not ill-appearing, toxic-appearing or diaphoretic.  HENT:     Head: Normocephalic and atraumatic.     Right Ear: External ear normal.     Left Ear: External ear normal.     Mouth/Throat:     Mouth: Mucous membranes are moist.     Pharynx: Oropharynx is clear. No oropharyngeal exudate or posterior oropharyngeal erythema.  Eyes:     General: No scleral icterus.       Right eye: No discharge.        Left eye: No discharge.     Extraocular Movements: Extraocular movements intact.     Conjunctiva/sclera: Conjunctivae normal.     Comments: Pupil of OD is misshapen and shifted medially.  Cardiovascular:     Rate and Rhythm: Normal rate and regular rhythm.  Pulmonary:     Effort: Pulmonary effort is normal. No  respiratory distress.     Breath sounds: Normal breath sounds.  Abdominal:     General: Bowel sounds are normal.     Tenderness: There is no abdominal tenderness. There is no guarding.     Hernia: A hernia is present. Hernia is present in the right inguinal area. There is no hernia in the left inguinal area.  Genitourinary:    Penis: Circumcised. No hypospadias, erythema, tenderness, discharge, swelling or lesions.      Testes:        Right: Mass, tenderness (appendix present) or swelling not present. Right testis is descended.        Left: Mass, tenderness or swelling not present. Left testis is descended.     Epididymis:     Right: Not inflamed.     Left: Not inflamed.     Prostate: Enlarged. Not tender and no nodules present.     Rectum: Guaiac result negative. No mass, tenderness, anal fissure, external hemorrhoid or internal hemorrhoid. Normal anal tone.  Musculoskeletal:     Cervical back: No rigidity or tenderness.  Lymphadenopathy:     Lower Body: No right inguinal adenopathy. No left inguinal adenopathy.  Skin:    General: Skin is warm and dry.  Neurological:     Mental Status: He is alert and oriented to person, place, and time.  Psychiatric:        Mood and Affect: Mood normal.        Behavior: Behavior normal.      No results found for any visits on 10/26/23.    The ASCVD Risk score (Arnett DK, et al., 2019) failed to calculate for the following reasons:   Cannot find a previous HDL lab   Cannot find a previous total cholesterol lab    Assessment & Plan:   Healthcare maintenance  Screening for colon cancer  Screening for cholesterol level -     Comprehensive metabolic panel -     Lipid panel  Screening for diabetes mellitus -     Comprehensive metabolic panel -     Hemoglobin A1c  Nocturia -     PSA -     Urinalysis, Routine w reflex microscopic  Elevated BP without diagnosis of hypertension -     CBC with Differential/Platelet -      Comprehensive metabolic panel  Encounter for hepatitis C screening test for low risk patient -     Hepatitis C antibody  Unilateral inguinal hernia without obstruction or gangrene, recurrence  not specified -     Ambulatory referral to General Surgery  Detached retina, right    Return in about 3 months (around 01/26/2024).  Information given on health maintenance and disease prevention.  Encouraged him to go ahead and send the Cologuard test.  Information was given on preventing hypertension.  He will check his pressures a few times a week and return with a list.  Information on inguinal hernias was given.  Surgical referral for evaluation.  Advised him to fluid restrict a couple hours before bedtime  Mliss Sax, MD

## 2023-10-27 LAB — COMPREHENSIVE METABOLIC PANEL
ALT: 25 U/L (ref 0–53)
AST: 18 U/L (ref 0–37)
Albumin: 4.5 g/dL (ref 3.5–5.2)
Alkaline Phosphatase: 74 U/L (ref 39–117)
BUN: 16 mg/dL (ref 6–23)
CO2: 28 meq/L (ref 19–32)
Calcium: 9.3 mg/dL (ref 8.4–10.5)
Chloride: 103 meq/L (ref 96–112)
Creatinine, Ser: 1.01 mg/dL (ref 0.40–1.50)
GFR: 82.23 mL/min (ref >60.00)
Glucose, Bld: 86 mg/dL (ref 70–99)
Potassium: 4.2 meq/L (ref 3.5–5.1)
Sodium: 139 meq/L (ref 135–145)
Total Bilirubin: 0.9 mg/dL (ref 0.2–1.2)
Total Protein: 6.8 g/dL (ref 6.0–8.3)

## 2023-10-27 LAB — URINALYSIS, ROUTINE W REFLEX MICROSCOPIC
Bilirubin Urine: NEGATIVE
Hgb urine dipstick: NEGATIVE
Ketones, ur: NEGATIVE
Leukocytes,Ua: NEGATIVE
Nitrite: NEGATIVE
RBC / HPF: NONE SEEN (ref 0–?)
Specific Gravity, Urine: 1.02 (ref 1.000–1.030)
Total Protein, Urine: NEGATIVE
Urine Glucose: NEGATIVE
Urobilinogen, UA: 0.2 (ref 0.0–1.0)
pH: 6 (ref 5.0–8.0)

## 2023-10-27 LAB — PSA: PSA: 0.72 ng/mL (ref 0.10–4.00)

## 2023-10-27 LAB — LIPID PANEL
Cholesterol: 172 mg/dL (ref 0–200)
HDL: 38.9 mg/dL — ABNORMAL LOW (ref >39.00)
LDL Cholesterol: 112 mg/dL — ABNORMAL HIGH (ref 0–99)
NonHDL: 133.33
Total CHOL/HDL Ratio: 4
Triglycerides: 107 mg/dL (ref 0.0–149.0)
VLDL: 21.4 mg/dL (ref 0.0–40.0)

## 2023-10-27 LAB — CBC WITH DIFFERENTIAL/PLATELET
Basophils Absolute: 0 10*3/uL (ref 0.0–0.1)
Basophils Relative: 1 % (ref 0.0–3.0)
Eosinophils Absolute: 0.2 10*3/uL (ref 0.0–0.7)
Eosinophils Relative: 4.6 % (ref 0.0–5.0)
HCT: 46.7 % (ref 39.0–52.0)
Hemoglobin: 15.9 g/dL (ref 13.0–17.0)
Lymphocytes Relative: 33.8 % (ref 12.0–46.0)
Lymphs Abs: 1.5 10*3/uL (ref 0.7–4.0)
MCHC: 33.9 g/dL (ref 30.0–36.0)
MCV: 94 fL (ref 78.0–100.0)
Monocytes Absolute: 0.3 10*3/uL (ref 0.1–1.0)
Monocytes Relative: 7.2 % (ref 3.0–12.0)
Neutro Abs: 2.3 10*3/uL (ref 1.4–7.7)
Neutrophils Relative %: 53.4 % (ref 43.0–77.0)
Platelets: 188 10*3/uL (ref 150.0–400.0)
RBC: 4.97 Mil/uL (ref 4.22–5.81)
RDW: 13.7 % (ref 11.5–15.5)
WBC: 4.3 10*3/uL (ref 4.0–10.5)

## 2023-10-27 LAB — HEMOGLOBIN A1C: Hgb A1c MFr Bld: 4.9 % (ref 4.6–6.5)

## 2023-10-28 LAB — HEPATITIS C ANTIBODY: Hepatitis C Ab: NONREACTIVE

## 2023-12-10 ENCOUNTER — Other Ambulatory Visit: Payer: Self-pay

## 2023-12-10 ENCOUNTER — Other Ambulatory Visit (HOSPITAL_COMMUNITY): Payer: Self-pay

## 2023-12-10 MED ORDER — SCOPOLAMINE 1 MG/3DAYS TD PT72: 1.0000 | MEDICATED_PATCH | TRANSDERMAL | 0 refills | Status: AC

## 2023-12-10 NOTE — Telephone Encounter (Signed)
 Copied from CRM [id number]. Topic: Clinical - Medication Question >> Dec 10, 2023 11:46 AM Elizebeth Brooking wrote: Reason for CRM: Patient is gong on a cruise wants to know if dr or nurse can send prescription od scopolamine . Is requesting a callback

## 2023-12-15 NOTE — Telephone Encounter (Signed)
 Copied from CRM [id number]. Topic: Clinical - Medication Question >> Dec 15, 2023  1:25 PM Geneva B wrote: Reason for CRM: patient is calling in because he is going on a curise and he needs motion sickness pathes but he called the pharmacy and they said he need pre approved patient would like to know if he could get this asap he needs if for 12/16/2023   Returned patient phone call regarding concerns; patient medication was refilled and pick up.

## 2023-12-18 ENCOUNTER — Telehealth: Payer: Self-pay

## 2023-12-18 ENCOUNTER — Other Ambulatory Visit (HOSPITAL_COMMUNITY): Payer: Self-pay

## 2023-12-18 NOTE — Telephone Encounter (Signed)
 A user error has taken place: encounter opened in error, closed for administrative reasons.

## 2024-01-08 ENCOUNTER — Other Ambulatory Visit (HOSPITAL_COMMUNITY): Payer: Self-pay
